# Patient Record
Sex: Female | Born: 1945 | Race: Black or African American | Hispanic: No | Smoking: Never smoker
Health system: Southern US, Community
[De-identification: ages and names within clinical notes are randomized; demographics above are authoritative.]

## PROBLEM LIST (undated history)

## (undated) DIAGNOSIS — F419 Anxiety disorder, unspecified: Secondary | ICD-10-CM

## (undated) DIAGNOSIS — M1710 Unilateral primary osteoarthritis, unspecified knee: Secondary | ICD-10-CM

## (undated) DIAGNOSIS — E7800 Pure hypercholesterolemia, unspecified: Secondary | ICD-10-CM

## (undated) DIAGNOSIS — M1990 Unspecified osteoarthritis, unspecified site: Secondary | ICD-10-CM

## (undated) DIAGNOSIS — G709 Myoneural disorder, unspecified: Secondary | ICD-10-CM

## (undated) DIAGNOSIS — Q6479 Other congenital malformations of bladder and urethra: Secondary | ICD-10-CM

## (undated) DIAGNOSIS — E119 Type 2 diabetes mellitus without complications: Secondary | ICD-10-CM

## (undated) DIAGNOSIS — R350 Frequency of micturition: Secondary | ICD-10-CM

## (undated) DIAGNOSIS — I10 Essential (primary) hypertension: Secondary | ICD-10-CM

## (undated) DIAGNOSIS — R3915 Urgency of urination: Secondary | ICD-10-CM

## (undated) DIAGNOSIS — G4733 Obstructive sleep apnea (adult) (pediatric): Secondary | ICD-10-CM

## (undated) DIAGNOSIS — R011 Cardiac murmur, unspecified: Secondary | ICD-10-CM

## (undated) DIAGNOSIS — I341 Nonrheumatic mitral (valve) prolapse: Secondary | ICD-10-CM

## (undated) DIAGNOSIS — G43909 Migraine, unspecified, not intractable, without status migrainosus: Secondary | ICD-10-CM

## (undated) DIAGNOSIS — N393 Stress incontinence (female) (male): Secondary | ICD-10-CM

## (undated) DIAGNOSIS — R0602 Shortness of breath: Secondary | ICD-10-CM

## (undated) DIAGNOSIS — K219 Gastro-esophageal reflux disease without esophagitis: Secondary | ICD-10-CM

## (undated) HISTORY — PX: CATARACT EXTRACTION W/ INTRAOCULAR LENS  IMPLANT, BILATERAL: SHX1307

## (undated) HISTORY — PX: JOINT REPLACEMENT: SHX530

## (undated) HISTORY — DX: Cardiac murmur, unspecified: R01.1

## (undated) HISTORY — PX: TONSILLECTOMY AND ADENOIDECTOMY: SUR1326

## (undated) HISTORY — PX: DILATION AND CURETTAGE OF UTERUS: SHX78

---

## 1988-04-10 HISTORY — PX: TOTAL ABDOMINAL HYSTERECTOMY: SHX209

## 2010-05-13 ENCOUNTER — Ambulatory Visit (INDEPENDENT_AMBULATORY_CARE_PROVIDER_SITE_OTHER): Payer: Self-pay | Admitting: Urology

## 2010-05-13 DIAGNOSIS — N813 Complete uterovaginal prolapse: Secondary | ICD-10-CM

## 2010-05-13 DIAGNOSIS — N3941 Urge incontinence: Secondary | ICD-10-CM

## 2010-05-13 DIAGNOSIS — N952 Postmenopausal atrophic vaginitis: Secondary | ICD-10-CM

## 2011-07-26 ENCOUNTER — Other Ambulatory Visit: Payer: Self-pay | Admitting: Urology

## 2011-08-21 ENCOUNTER — Encounter (HOSPITAL_BASED_OUTPATIENT_CLINIC_OR_DEPARTMENT_OTHER): Payer: Self-pay | Admitting: *Deleted

## 2011-08-21 NOTE — Progress Notes (Signed)
NPO AFTER MN. ARRIVES AT 0715. NEEDS ISTAT. CURRENT EKG, ECHO, OFFICE NOTE, AND STRESS TEST TO BE FAXED FROM DR [facility] [phone]). WILL TAKE TAGAMENT AM OF SURG. W/ SIP OF WATER.  REVIEWED [facility] GUIDELINES, WILL BRING MEDS.

## 2011-08-25 ENCOUNTER — Encounter (HOSPITAL_BASED_OUTPATIENT_CLINIC_OR_DEPARTMENT_OTHER)
Admission: RE | Disposition: A | Discharge status: Home / Self Care | Payer: Self-pay | Source: Ambulatory Visit | Attending: Urology

## 2011-08-25 ENCOUNTER — Encounter (HOSPITAL_BASED_OUTPATIENT_CLINIC_OR_DEPARTMENT_OTHER): Payer: Self-pay | Admitting: Anesthesiology

## 2011-08-25 ENCOUNTER — Encounter (HOSPITAL_BASED_OUTPATIENT_CLINIC_OR_DEPARTMENT_OTHER): Payer: Self-pay | Admitting: *Deleted

## 2011-08-25 ENCOUNTER — Ambulatory Visit (HOSPITAL_BASED_OUTPATIENT_CLINIC_OR_DEPARTMENT_OTHER)
Admission: RE | Admit: 2011-08-25 | Discharge: 2011-08-26 | Disposition: A | Discharge status: Home / Self Care | Payer: Medicare Other | Source: Ambulatory Visit | Attending: Urology | Admitting: Urology

## 2011-08-25 ENCOUNTER — Ambulatory Visit (HOSPITAL_BASED_OUTPATIENT_CLINIC_OR_DEPARTMENT_OTHER): Payer: Medicare Other | Admitting: Anesthesiology

## 2011-08-25 DIAGNOSIS — I059 Rheumatic mitral valve disease, unspecified: Secondary | ICD-10-CM | POA: Insufficient documentation

## 2011-08-25 DIAGNOSIS — N058 Unspecified nephritic syndrome with other morphologic changes: Secondary | ICD-10-CM | POA: Insufficient documentation

## 2011-08-25 DIAGNOSIS — N8110 Cystocele, unspecified: Secondary | ICD-10-CM | POA: Insufficient documentation

## 2011-08-25 DIAGNOSIS — K219 Gastro-esophageal reflux disease without esophagitis: Secondary | ICD-10-CM | POA: Insufficient documentation

## 2011-08-25 DIAGNOSIS — Z79899 Other long term (current) drug therapy: Secondary | ICD-10-CM | POA: Insufficient documentation

## 2011-08-25 DIAGNOSIS — G4730 Sleep apnea, unspecified: Secondary | ICD-10-CM | POA: Insufficient documentation

## 2011-08-25 DIAGNOSIS — E7800 Pure hypercholesterolemia, unspecified: Secondary | ICD-10-CM | POA: Insufficient documentation

## 2011-08-25 DIAGNOSIS — N816 Rectocele: Secondary | ICD-10-CM | POA: Insufficient documentation

## 2011-08-25 DIAGNOSIS — N3941 Urge incontinence: Secondary | ICD-10-CM | POA: Insufficient documentation

## 2011-08-25 DIAGNOSIS — E1129 Type 2 diabetes mellitus with other diabetic kidney complication: Secondary | ICD-10-CM | POA: Insufficient documentation

## 2011-08-25 DIAGNOSIS — I10 Essential (primary) hypertension: Secondary | ICD-10-CM | POA: Insufficient documentation

## 2011-08-25 HISTORY — DX: Urgency of urination: R39.15

## 2011-08-25 HISTORY — DX: Frequency of micturition: R35.0

## 2011-08-25 HISTORY — DX: Shortness of breath: R06.02

## 2011-08-25 HISTORY — DX: Gastro-esophageal reflux disease without esophagitis: K21.9

## 2011-08-25 HISTORY — PX: PUBOVAGINAL SLING: SHX1035

## 2011-08-25 HISTORY — DX: Other congenital malformations of bladder and urethra: Q64.79

## 2011-08-25 HISTORY — DX: Obstructive sleep apnea (adult) (pediatric): G47.33

## 2011-08-25 HISTORY — DX: Nonrheumatic mitral (valve) prolapse: I34.1

## 2011-08-25 HISTORY — DX: Stress incontinence (female) (male): N39.3

## 2011-08-25 HISTORY — DX: Migraine, unspecified, not intractable, without status migrainosus: G43.909

## 2011-08-25 LAB — POCT I-STAT, CHEM 8
BUN: 11 mg/dL (ref 6–23)
Calcium, Ion: 1.33 mmol/L — ABNORMAL HIGH (ref 1.12–1.32)
Chloride: 106 mEq/L (ref 96–112)
Creatinine, Ser: 0.8 mg/dL (ref 0.50–1.10)

## 2011-08-25 LAB — GLUCOSE, CAPILLARY
Glucose-Capillary: 131 mg/dL — ABNORMAL HIGH (ref 70–99)
Glucose-Capillary: 238 mg/dL — ABNORMAL HIGH (ref 70–99)

## 2011-08-25 SURGERY — CREATION, PUBOVAGINAL SLING
Anesthesia: General

## 2011-08-25 MED ORDER — METANX 3-35-2 MG PO TABS: 1.0000 | ORAL_TABLET | Freq: Every day | ORAL | Status: DC

## 2011-08-25 MED ORDER — ACETAMINOPHEN 10 MG/ML IV SOLN
1000.0000 mg | Freq: Four times a day (QID) | INTRAVENOUS | Status: DC
  Administered 2011-08-25 – 2011-08-26 (×2): 1000 mg via INTRAVENOUS

## 2011-08-25 MED ORDER — SODIUM CHLORIDE 0.9 % IJ SOLN
INTRAMUSCULAR | Status: DC | PRN
  Administered 2011-08-25: 50 mL

## 2011-08-25 MED ORDER — STERILE WATER FOR IRRIGATION IR SOLN
Status: DC | PRN
  Administered 2011-08-25: 3000 mL via INTRAVESICAL

## 2011-08-25 MED ORDER — SODIUM CHLORIDE 0.9 % IR SOLN
Status: DC | PRN
  Administered 2011-08-25: 10:00:00

## 2011-08-25 MED ORDER — GENTAMICIN IN SALINE 1.6-0.9 MG/ML-% IV SOLN
80.0000 mg | INTRAVENOUS | Status: AC
  Administered 2011-08-25: 80 mg via INTRAVENOUS

## 2011-08-25 MED ORDER — KETOROLAC TROMETHAMINE 30 MG/ML IJ SOLN
INTRAMUSCULAR | Status: DC | PRN
  Administered 2011-08-25: 15 mg via INTRAVENOUS

## 2011-08-25 MED ORDER — DIPHENHYDRAMINE HCL 50 MG/ML IJ SOLN: 12.5000 mg | Freq: Four times a day (QID) | INTRAMUSCULAR | Status: DC | PRN

## 2011-08-25 MED ORDER — OXYBUTYNIN CHLORIDE 5 MG PO TABS: 5.0000 mg | ORAL_TABLET | Freq: Three times a day (TID) | ORAL | Status: DC | PRN

## 2011-08-25 MED ORDER — HYDROMORPHONE HCL PF 1 MG/ML IJ SOLN: 0.5000 mg | INTRAMUSCULAR | Status: DC | PRN

## 2011-08-25 MED ORDER — DIPHENHYDRAMINE HCL 12.5 MG/5ML PO ELIX: 12.5000 mg | ORAL_SOLUTION | Freq: Four times a day (QID) | ORAL | Status: DC | PRN

## 2011-08-25 MED ORDER — ACETAMINOPHEN 10 MG/ML IV SOLN
INTRAVENOUS | Status: DC | PRN
  Administered 2011-08-25: 1000 mg via INTRAVENOUS

## 2011-08-25 MED ORDER — ZOLPIDEM TARTRATE 5 MG PO TABS: 5.0000 mg | ORAL_TABLET | Freq: Every evening | ORAL | Status: DC | PRN

## 2011-08-25 MED ORDER — SENNA 8.6 MG PO TABS
1.0000 | ORAL_TABLET | Freq: Two times a day (BID) | ORAL | Status: DC
  Administered 2011-08-25: 8.6 mg via ORAL

## 2011-08-25 MED ORDER — KETOROLAC TROMETHAMINE 30 MG/ML IJ SOLN
15.0000 mg | Freq: Once | INTRAMUSCULAR | Status: AC | PRN
  Administered 2011-08-25: 30 mg via INTRAVENOUS

## 2011-08-25 MED ORDER — ONDANSETRON HCL 4 MG/2ML IJ SOLN
INTRAMUSCULAR | Status: DC | PRN
  Administered 2011-08-25: 4 mg via INTRAVENOUS

## 2011-08-25 MED ORDER — SODIUM CHLORIDE 0.45 % IV SOLN: INTRAVENOUS | Status: DC

## 2011-08-25 MED ORDER — DEXAMETHASONE SODIUM PHOSPHATE 4 MG/ML IJ SOLN
INTRAMUSCULAR | Status: DC | PRN
  Administered 2011-08-25: 4 mg via INTRAVENOUS

## 2011-08-25 MED ORDER — ONDANSETRON HCL 4 MG/2ML IJ SOLN: 4.0000 mg | INTRAMUSCULAR | Status: DC | PRN

## 2011-08-25 MED ORDER — HYDROMORPHONE HCL PF 1 MG/ML IJ SOLN: 0.2500 mg | INTRAMUSCULAR | Status: DC | PRN

## 2011-08-25 MED ORDER — LIDOCAINE HCL (CARDIAC) 20 MG/ML IV SOLN
INTRAVENOUS | Status: DC | PRN
  Administered 2011-08-25: 50 mg via INTRAVENOUS

## 2011-08-25 MED ORDER — PROPOFOL 10 MG/ML IV EMUL
INTRAVENOUS | Status: DC | PRN
  Administered 2011-08-25: 200 mg via INTRAVENOUS

## 2011-08-25 MED ORDER — FENTANYL CITRATE 0.05 MG/ML IJ SOLN
INTRAMUSCULAR | Status: DC | PRN
  Administered 2011-08-25: 12.5 ug via INTRAVENOUS
  Administered 2011-08-25: 25 ug via INTRAVENOUS
  Administered 2011-08-25: 12.5 ug via INTRAVENOUS
  Administered 2011-08-25: 50 ug via INTRAVENOUS
  Administered 2011-08-25 (×2): 25 ug via INTRAVENOUS

## 2011-08-25 MED ORDER — ESTRADIOL 0.1 MG/GM VA CREA
TOPICAL_CREAM | VAGINAL | Status: DC | PRN
  Administered 2011-08-25: 3 via VAGINAL

## 2011-08-25 MED ORDER — CIPROFLOXACIN HCL 250 MG PO TABS: 250.0000 mg | ORAL_TABLET | Freq: Two times a day (BID) | ORAL | Status: AC

## 2011-08-25 MED ORDER — INDIGOTINDISULFONATE SODIUM 8 MG/ML IJ SOLN
INTRAMUSCULAR | Status: DC | PRN
  Administered 2011-08-25: 5 mL via INTRAVENOUS

## 2011-08-25 MED ORDER — PROMETHAZINE HCL 25 MG/ML IJ SOLN: 6.2500 mg | INTRAMUSCULAR | Status: DC | PRN

## 2011-08-25 MED ORDER — ENOXAPARIN SODIUM 40 MG/0.4ML ~~LOC~~ SOLN: 40.0000 mg | SUBCUTANEOUS | Status: DC

## 2011-08-25 MED ORDER — ATENOLOL 12.5 MG HALF TABLET: 12.5000 mg | ORAL_TABLET | Freq: Every day | ORAL | Status: DC

## 2011-08-25 MED ORDER — BUPIVACAINE-EPINEPHRINE 0.5% -1:200000 IJ SOLN
INTRAMUSCULAR | Status: DC | PRN
  Administered 2011-08-25: 30 mL

## 2011-08-25 MED ORDER — ASPIRIN-ACETAMINOPHEN-CAFFEINE 250-250-65 MG PO TABS: 1.0000 | ORAL_TABLET | Freq: Four times a day (QID) | ORAL | Status: DC | PRN

## 2011-08-25 MED ORDER — BELLADONNA ALKALOIDS-OPIUM 16.2-60 MG RE SUPP
RECTAL | Status: DC | PRN
  Administered 2011-08-25: 1 via RECTAL

## 2011-08-25 MED ORDER — CIPROFLOXACIN IN D5W 400 MG/200ML IV SOLN
400.0000 mg | INTRAVENOUS | Status: AC
  Administered 2011-08-25: 400 mg via INTRAVENOUS

## 2011-08-25 MED ORDER — FAMOTIDINE 10 MG PO TABS: 10.0000 mg | ORAL_TABLET | Freq: Every day | ORAL | Status: DC

## 2011-08-25 MED ORDER — LACTATED RINGERS IV SOLN
INTRAVENOUS | Status: DC
  Administered 2011-08-25 (×2): via INTRAVENOUS

## 2011-08-25 MED ORDER — METFORMIN HCL 500 MG PO TABS: 500.0000 mg | ORAL_TABLET | Freq: Two times a day (BID) | ORAL | Status: DC

## 2011-08-25 MED ORDER — HYDROCODONE-ACETAMINOPHEN 5-325 MG PO TABS: 1.0000 | ORAL_TABLET | ORAL | Status: DC | PRN

## 2011-08-25 SURGICAL SUPPLY — 53 items
ADH SKN CLS APL DERMABOND .7 (GAUZE/BANDAGES/DRESSINGS)
BAG URINE DRAINAGE (UROLOGICAL SUPPLIES) ×2 IMPLANT
BLADE SURG 10 STRL SS (BLADE) ×2 IMPLANT
BLADE SURG 15 STRL LF DISP TIS (BLADE) ×1 IMPLANT
BLADE SURG 15 STRL SS (BLADE) ×2
BLADE SURG ROTATE 9660 (MISCELLANEOUS) ×2 IMPLANT
BOOTIES KNEE HIGH SLOAN (MISCELLANEOUS) ×2 IMPLANT
BOSTON SCIENTIFIC UPHOLD LITE VAGINAL SUPPORT SYSTEM IMPLANT
CANISTER SUCTION 1200CC (MISCELLANEOUS) IMPLANT
CANISTER SUCTION 2500CC (MISCELLANEOUS) ×4 IMPLANT
CATH FOLEY 2WAY SLVR  5CC 16FR (CATHETERS) ×1
CATH FOLEY 2WAY SLVR 5CC 16FR (CATHETERS) ×1 IMPLANT
CLOTH BEACON ORANGE TIMEOUT ST (SAFETY) ×2 IMPLANT
COVER MAYO STAND STRL (DRAPES) ×2 IMPLANT
COVER TABLE BACK 60X90 (DRAPES) ×2 IMPLANT
DERMABOND ADVANCED (GAUZE/BANDAGES/DRESSINGS)
DERMABOND ADVANCED .7 DNX12 (GAUZE/BANDAGES/DRESSINGS) IMPLANT
DISSECTOR ROUND CHERRY 3/8 STR (MISCELLANEOUS) ×2 IMPLANT
DRAPE CAMERA CLOSED 9X96 (DRAPES) ×2 IMPLANT
DRAPE UNDERBUTTOCKS STRL (DRAPE) ×2 IMPLANT
FLOSEAL 10ML (HEMOSTASIS) IMPLANT
GAUZE SPONGE 4X4 16PLY XRAY LF (GAUZE/BANDAGES/DRESSINGS) IMPLANT
GLOVE BIO SURGEON STRL SZ7.5 (GLOVE) ×2 IMPLANT
GLOVE INDICATOR 6.5 STRL GRN (GLOVE) ×2 IMPLANT
GLOVE INDICATOR 7.0 STRL GRN (GLOVE) ×2 IMPLANT
GOWN PREVENTION PLUS LG XLONG (DISPOSABLE) ×2 IMPLANT
GOWN STRL REIN XL XLG (GOWN DISPOSABLE) ×2 IMPLANT
NEEDLE HYPO 22GX1.5 SAFETY (NEEDLE) ×4 IMPLANT
PACK BASIN DAY SURGERY FS (CUSTOM PROCEDURE TRAY) ×2 IMPLANT
PACKING VAGINAL (PACKING) ×4 IMPLANT
PENCIL BUTTON HOLSTER BLD 10FT (ELECTRODE) ×2 IMPLANT
PLUG CATH AND CAP STER (CATHETERS) ×2 IMPLANT
RETRACTOR LONRSTAR 16.6X16.6CM (MISCELLANEOUS) ×1 IMPLANT
RETRACTOR STAY HOOK 5MM (MISCELLANEOUS) ×2 IMPLANT
RETRACTOR STER APS 16.6X16.6CM (MISCELLANEOUS) ×2
SET IRRIG Y TYPE TUR BLADDER L (SET/KITS/TRAYS/PACK) ×2 IMPLANT
SHEET LAVH (DRAPES) ×2 IMPLANT
SLING SOLYX SYSTEM SIS BX (SLING) IMPLANT
SPONGE LAP 4X18 X RAY DECT (DISPOSABLE) ×4 IMPLANT
SUCTION FRAZIER TIP 10 FR DISP (SUCTIONS) ×2 IMPLANT
SUT ETHILON 2 0 PS N (SUTURE) IMPLANT
SUT MON AB 2-0 SH 27 (SUTURE) ×2
SUT MON AB 2-0 SH27 (SUTURE) ×1 IMPLANT
SUT SILK 3 0 PS 1 (SUTURE) IMPLANT
SUT VIC AB 2-0 UR6 27 (SUTURE) ×16 IMPLANT
SYR BULB IRRIGATION 50ML (SYRINGE) ×2 IMPLANT
SYR CONTROL 10ML LL (SYRINGE) ×2 IMPLANT
SYRINGE 10CC LL (SYRINGE) ×4 IMPLANT
TRAY DSU PREP LF (CUSTOM PROCEDURE TRAY) ×2 IMPLANT
TUBE CONNECTING 12X1/4 (SUCTIONS) ×4 IMPLANT
Uphold LITE Vaginal Support System (Mesh Fixation) ×2 IMPLANT
WATER STERILE IRR 500ML POUR (IV SOLUTION) ×8 IMPLANT
YANKAUER SUCT BULB TIP NO VENT (SUCTIONS) IMPLANT

## 2011-08-25 NOTE — H&P (Signed)
Problems  1. 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 Urge Incontinence Of Urine 788.31 4. 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 Vaginal Wall Prolapse With Rectocele 618.04  History of Present Illness    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 She is a 3 year history of progressive vaginal prolapse, protruding from the introitus. She complains of urge incontinence, worse with standing from a sitting position, nocturia x1, and urinary daytime frequency q.1 hour. She denies stress incontinence. She is status post hysterectomy in 1990, and is gravida 3 para 3. She has been treated with anticholinergics in the past, with Detrol and Vesicare, but neither medications worked. She has no tobacco history. She is not sexually active. She does not complain of vaginal dryness. 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  Her physical examination showed a midline anterior vaginal wall defect, grade 3, stage III. There is no enterocele found there is a rectocele, with a midline defect, grade 1, stage I. The cervix was absent the uterus is absent. The bladder was otherwise normal and nontender. The rectum also appeared normal on examination.  Urodynamics on 07/18/11, shows a maximum capacity of 500 cc, with first sensation at 250 cc, and normal desire at 306 cc peri-strong desire was at 500 cc. The bladder becomes unstable, with a contraction pressure of 12 cm water. No urine leakage occurs, however.  The patient did not leak for Valsalva leak point pressure, with abdominal pressures of 104-113 cm water. There was no stress incontinence noted with or without reduction of the prolapse. She is a very weak Valsalva, and very weak cough, however.  Pressure flow study shows a maximum flow rate of 16 cc per second, with detrusor pressure at maximum flow of 9 cm water, and PVR 40.  There was no stress  incontinence noted with this study, with or without reduction of the prolapse. While she does have some bladder instability, she is able to inhibit these contractions. The patient was able to generate a voluntary contraction. She was able to void with the prolapse reduced. PVRs 40 cc.   Past Medical History Problems  1. History of  Adult Sleep Apnea 780.57 2. History of  Anxiety (Symptom) 300.00 3. History of  Depression 311 4. History of  Diabetes Mellitus 250.00 5. History of  Esophageal Reflux 530.81 6. History of  Hypercholesterolemia 272.0 7. History of  Hypertension 401.9 8. History of  Murmurs 785.2 9. History of  Peripheral Neuropathy 356.9 10. 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 History of  Hysterectomy V45.77 2. History of  Tonsillectomy  Current Meds 1. Atenolol TABS; Therapy: (Recorded:03Feb2012) to 2. Fish Oil CAPS; Therapy: (Recorded:03Feb2012) to 3. Metanx TABS; Therapy: (Recorded:03Feb2012) to 4. MetFORMIN HCl TABS; Therapy: (Recorded:03Feb2012) to 5. St Johns Wort CAPS; Therapy: (Recorded:03Feb2012) to 6. Vitamin D CAPS; Therapy: (Recorded:03Feb2012) to 7. Vitamin E CAPS; Therapy: (Recorded:03Feb2012) to  Allergies Medication  1. No Known Drug Allergies  Family History Problems  1. Family history of  Cirrhosis 2. Family history of  Death In The Family Father 46 yo - cirrhosis 3. Family history of  Death In The Family Mother 42 yo - lung disease 4. Family history of  Diabetes Mellitus V18.0 5. Family history of  Family Health Status Number Of Children 2 sons. 2 dtrs. 6. Family history of  Lung Cancer V16.1  Social History Problems  1.  Caffeine Use 1/d 2. Marital History - Single 3. Never A Smoker 4. Occupation: Retired Nurse, children's  5. History of  Alcohol Use 6. History of  Tobacco Use  Review of Systems Genitourinary, constitutional, skin, eye, otolaryngeal, hematologic/lymphatic, cardiovascular,  pulmonary, endocrine, musculoskeletal, gastrointestinal, neurological and psychiatric system(s) were reviewed and pertinent findings if present are noted.  Genitourinary: urinary frequency, urinary urgency, nocturia and incontinence.  Gastrointestinal: heartburn and diarrhea.  Constitutional: feeling tired (fatigue).  Integumentary: skin rash/lesion and pruritus.  Hematologic/Lymphatic: a tendency to easily bruise.  Cardiovascular: leg swelling.  Neurological: headache.  Psychiatric: depression.    Vitals Vital Signs [Data Includes: Last 1 Day]  17Apr2013 11:00AM  Blood Pressure: 114 / 78 Temperature: 97.6 F Heart Rate: 71  Physical Exam Genitourinary:. NST. Weak Levator contraction. Examination of the external genitalia shows vulvar atrophy, but normal female external genitalia, no vulvar mass and no labial adhesions. The urethra is normal in appearance, not tender and no urethral caruncle. There is no urethral mass. Urethral hypermobility is present. There is no urethral discharge. No periurethral cyst is identified. There is no urethral prolapse. Vaginal exam demonstrates atrophy and the vaginal epithelium to be poorly estrogenized. A cystocele is present with a midline defect (grade 3 /4). No enterocele is identified. A rectocele is present with a midline defect (grade 1 /4). The cervix is is absent. The uterus is absent. The bladder is normal on palpation, non tender and not distended. The anus is normal on inspection.    Results/Data Urine [Data Includes: Last 1 Day]   17Apr2013  COLOR YELLOW   APPEARANCE CLEAR   SPECIFIC GRAVITY 1.025   pH 5.5   GLUCOSE NEG mg/dL  BILIRUBIN NEG   KETONE NEG mg/dL  BLOOD NEG   PROTEIN NEG mg/dL  UROBILINOGEN 0.2 mg/dL  NITRITE NEG   LEUKOCYTE ESTERASE NEG    Assessment Assessed  1. 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 Urge Incontinence Of Urine 788.31 3. Vaginal Wall Prolapse With Midline Cystocele 618.01 4. Postmenopausal Atrophic Vaginitis  627.3      Mitral vlave prolapse in pt with Stage II cystocele, and no incontinence, even with reduction of her prolapse. We have discussed vaginal prolapse, the history, anatomy, and physiology reviewed.  We have discusssed complications also, and have presented 3 options for repair, including, simple Kelly plication ( 50% failure rate), tissue repair ( 20% falilure rate in 2-5 yrs); and [employer] mesh repair ( 3% failure rate in 2-5 yrs). Additional complications of mesh are exposure ( 2-3% chance,. treated with hormones or surgerical excision. We have discussed the operation, treatment of pain, and post op expectations. She may drive by 1 week. She may need future anti-incontinence surgery, but no indication for that at this time.    We have discussed her hernia-or weakness- in the vaginal wall, allowing one or more organs to begin to descend-both by the pull of gravity, and by the push of abdominal straining-into, and occasionally through-the vagina. This anatomic herniation is called Pelvic Organ Prolapse(POP).   She  has:  (1) weakness of the urethra- with hypermobility but withour  "stress "(cough laugh, or sneeze) urinary incontinence; (2) weakness of the anterior vaginal wall, resulting in a Cystocele; and (3), weakness  of the posterior vaginal wall, without Enterocele(small intestine herniation  high up through the posterior vaginal wall), but with small  Rectocele(rectum herniating through the posterior vaginal wall).   These  anatomic problems may be directly related to childbirth, neuropathies, diabetes, constipation, cigarette smoking, steroid  use, diseases of the connective tissue(eg: lupus, steroids for chronic asthma), or previous pelvic surgery, radiation, or chemotherapy. The surgical repair which is recommended for you is based on an integration of your symptoms, your medical history, your physical examination; as well as your video urodynamic studies, and laboratory  evaluation. In the end, the final anatomic evaluation is performed under anesthesia, and it is for this reason, that the end result operation may be slightly different than the preoperative evaluation might suggest. We are prepared to repair the pelvic floor in any number of ways, which will be explained to you.    Pelvic floor repair was first reported in 1913, with success rates of 50-90%. However, long-term followup of repairs using absorbable tissue has shown 20% to 50% recurrence rates. Therefore, when synthetic mesh was introduced into pelvic floor repairs in 2000, it was quickly incorporated into surgical approaches. However, early evaluation of surgical mesh (no longer in use)(2000 -2005) showed technical flaws which led to erosion of the mesh through the vaginal incision. This has led to multiple lawsuits with regard to the use of vaginal mesh. Currently, mesh erosion is noted to be a complication of pelvic organ prolapse surgery, as reported in 2-11% of cases.  Evaluation of a single surgeon's experience with the newest vaginal mesh [facility] Scientific mesh, Patsi Sears, January 2009 through August 2011) has shown no erosions from pubovaginal sling surgery, and a 2% erosion rate from other larger pelvic floor organ repair surgery. These extrusions have been treated by vaginal hormone treatment, and surgical excision of a tiny portion of the extruded mesh. There have been no severe long-term complications noted (up to 3 year follow-up).    Anterior vault suspension is accomplished by placing mesh between the bladder  and vaginal wall, and attaching the mesh to the sacrospinous ligaments. This prevents the anterior vault hernia from recurring.  Anterior vault suspension can also be accomplished via the laparoscopic robotic approach. While this approach gives excellent results, it is more expensive, and more invasive. Transvaginal surgery allows the patient to have natural orifice surgical approach  ("Notes" approach), which is less invasive, and less expensive, while providing superb results. Recent evaluation (see above) has shown a single surgeon's results with 100 cases of anterior and posterior vault suspension using [employer] mesh. These results show a 2-3% recurrence rate, 2-3% extrusion rate, on patients with grade 4 prolapse (worst prolapse).  Common questions for patient's regarding pelvic organ prolapse are as follows:  #1  Is mesh to be used in my surgery?   #2   Am I a good candidate for mesh?  #3 Why is mesh the best option for my repair?  #4 What are the alternatives to transvaginal mesh for repair of my prolapse?  #5 Could my surgery be performed without using mesh? #6 Will my partner be able to feel the surgical mesh during sexual intercourse, and what if the mesh erodes through the vaginal wall?  #7 How often have you ,as a Careers adviser ,implanted this particular product?  #8 What results have your other patients  had with this product?  #9 What can I expect to feel after surgery, and for how long?  #10 Which specific side effects should I report after surgery ? #11 What if the mesh surgery doesn't correct the problem ?   I believe surgical mesh implantation will correct your pelvic organ prolapse problem, with a very low chance of complications. As per the FDA  communication of July 2011, is  important to understand that Dr.Alanis Clift has had 32 years of surgical experience in incontinence and pelvic organ repair, with special training in the implantation of pelvic organ prolapse mesh. He is an Secondary school teacher in the [employer] pelvic organ prolapse surgical teaching laboratories. In consideration of all types of pelvic organ repairs, please know that we have considered" no repair"; repair via vaginal approach; repair via abdominal approach-using native tissue, absorbable tissue, and nonabsorbable mesh. All of the  above questions have been dicussed wit th  patient, and she is satisfied with our approach to her repair.    Plan Urge Incontinence Of Urine (788.31)  1. VESIcare 5 MG Oral Tablet; a tablet daily for 30 days; Therapy: 23Apr2012 to 17Apr2013; Last  Rx:23Apr2012; Status: DISCONTINUED Vaginal Wall Prolapse With Midline Cystocele (618.01), Postmenopausal Atrophic Vaginitis (627.3), Diabetic Nephropathy (583.81)  2. Follow-up Schedule Surgery Office  Follow-up  Done: 17Apr2013      After consideration of her options, she desires to have mesh implantation for her vault repair. We have discussed the surgery, complications, and post op care.   Signatures Electronically signed by : [name], M.D.; Jul 26 2011 12:05PM

## 2011-08-25 NOTE — Anesthesia Procedure Notes (Signed)
Procedure Name: LMA Insertion Date/Time: 08/25/2011 8:48 AM Performed by: Norva Pavlov Pre-anesthesia Checklist: Patient identified, Emergency Drugs available, Suction available and Patient being monitored Patient Re-evaluated:Patient Re-evaluated prior to inductionOxygen Delivery Method: Circle System Utilized Preoxygenation: Pre-oxygenation with 100% oxygen Intubation Type: IV induction Ventilation: Mask ventilation without difficulty LMA: LMA with gastric port inserted LMA Size: 4.0 Number of attempts: 1 Placement Confirmation: positive ETCO2 Tube secured with: Tape Dental Injury: Teeth and Oropharynx as per pre-operative assessment

## 2011-08-25 NOTE — Anesthesia Preprocedure Evaluation (Addendum)
Anesthesia Evaluation  Patient identified by MRN, date of birth, ID band Patient awake    Reviewed: Allergy & Precautions, H&P , NPO status , Patient's Chart, lab work & pertinent test results  Airway Mallampati: II TM Distance: <3 FB Neck ROM: Full    Dental No notable dental hx.    Pulmonary sleep apnea ,  breath sounds clear to auscultation  Pulmonary exam normal       Cardiovascular hypertension, Pt. on medications Rhythm:Regular Rate:Normal     Neuro/Psych negative neurological ROS  negative psych ROS   GI/Hepatic Neg liver ROS, GERD-  Medicated,  Endo/Other  Diabetes mellitus-, Type obesity  Renal/GU negative Renal ROS  negative genitourinary   Musculoskeletal negative musculoskeletal ROS (+)   Abdominal   Peds negative pediatric ROS (+)  Hematology negative hematology ROS (+)   Anesthesia Other Findings   Reproductive/Obstetrics negative OB ROS                          Anesthesia Physical Anesthesia Plan  ASA: II  Anesthesia Plan: General   Post-op Pain Management:    Induction: Intravenous  Airway Management Planned: LMA and Oral ETT  Additional Equipment:   Intra-op Plan:   Post-operative Plan: Extubation in OR  Informed Consent: I have reviewed the patients History and Physical, chart, labs and discussed the procedure including the risks, benefits and alternatives for the proposed anesthesia with the patient or authorized representative who has indicated his/her understanding and acceptance.   Dental advisory given  Plan Discussed with: CRNA  Anesthesia Plan Comments:        Anesthesia Quick Evaluation

## 2011-08-25 NOTE — Op Note (Signed)
Pre-operative diagnosis : Anterior vaginal vault prolapse, stage III  Postoperative diagnosis: Same  Operation: [employer] uphold light anterior vault sacrospinous fixation mesh repair with Tresa Endo plication  Surgeon:  S. Patsi Sears, MD  First assistant: None  Anesthesia:  general  Preparation: After appropriate preanesthesia, the patient was brought to the operating room and placed on the operating table in the dorsal supine position, where general LMA anesthesia was introduced. Armband was rechecked. She was replaced in the dorsal lithotomy position, where the pubis was prepped with Betadine solution, and draped in usual fashion.  Review history: The patient is a 66 year old female with a protruding vaginal vault prolapse, noted to have a stage III anterior vault prolapse. She has no stress incontinence, either by clinical history, or 01 video urodynamics. She was noted to have a small stage I rectocele, it was felt that by repairing her Level One vault suspension that she may not need to have posterior pelvic floor repair. In addition, she understands that she may need a sling in the future, and that this can be accomplished in outpatient setting.  Statement of  Likelihood of Success: Excellent. TIME-OUT observed.:  Procedure: Visual inspection shows a large anterior vault prolapse, 3 cm beyond the hymenal ring. Foley catheter is placed and the portion retractors placed. Using a marking pen, the bladder neck is identified, and a semilunar incision is outlined 2 cm proximal to the urethral vesical junction. Infusion of 50 cc of Marcaine 0.25 with epinephrine 1 200,000 and normal saline mixture is then used for tissue dissection. A 4 cm horizontal incision is then made and subcutaneous tissue was dissected. Using sharp and blunt dissection, the anterior vaginal wall is dissected free from the bladder. A small Kelly plication is then accomplished. Dissection is carried into the pelvic floor.  The ischial spines are identified, and the sacrospinous ligament is identified bilaterally. On the left side, the ligament is noted to be bulky filamentous. Indigocarmine was given at this time.  Using the [facility] device, the [employer] Mesh limbs wereplaced through the sacrospinous ligamenst bilaterally, one to 2 fingerbreadths medial to the ischial spines. The limbs are then brought across the wound lateralward, and the mesh is anchored around the apex with 2-0 Vicryl suture in 3 areas proximalward, and 3 areas distalward. The mesh was smooth against the bladder, without tension. The plastic sleeves were cut appropriately, and removed. Cystoscopy was accomplished, and jets of blue contrast were identified from each orifice. The wound edges were freshened, and closed with 2 separate 2-0 Vicryl sutures. Vaginal packing was placed. The patient was then awakened and taken to recovery room in good condition.

## 2011-08-25 NOTE — Transfer of Care (Signed)
Immediate Anesthesia Transfer of Care Note  Patient: Kristin Farley  Procedure(s) Performed: Procedure(s) (LRB): PUBO-VAGINAL SLING (N/A)  Patient Location: PACU  Anesthesia Type: General  Level of Consciousness: awake, drowsy and oriented  Airway & Oxygen Therapy: Patient Spontanous Breathing and Patient connected to face mask oxygen  Post-op Assessment: Report given to PACU RN and Post -op Vital signs reviewed and stable  Post vital signs: Reviewed and stable  Complications: No apparent anesthesia complications

## 2011-08-25 NOTE — Interval H&P Note (Signed)
History and Physical Interval Note:  08/25/2011 8:28 AM  Kristin Farley  has presented today for surgery, with the diagnosis of ANTERIOR VAGINAL VAULT PROLAPSE  The various methods of treatment have been discussed with the patient and family. After consideration of risks, benefits and other options for treatment, the patient has consented to  Procedure(s) (LRB): PUBO-VAGINAL SLING (N/A) as a surgical intervention .  The patients' history has been reviewed, patient examined, no change in status, stable for surgery.  I have reviewed the patients' chart and labs.  Questions were answered to the patient's satisfaction.     [name] I

## 2011-08-25 NOTE — Anesthesia Postprocedure Evaluation (Signed)
  Anesthesia Post-op Note  Patient: Kristin Farley  Procedure(s) Performed: Procedure(s) (LRB): PUBO-VAGINAL SLING (N/A)  Patient Location: PACU  Anesthesia Type: General  Level of Consciousness: awake and alert   Airway and Oxygen Therapy: Patient Spontanous Breathing  Post-op Pain: mild  Post-op Assessment: Post-op Vital signs reviewed, Patient's Cardiovascular Status Stable, Respiratory Function Stable, Patent Airway and No signs of Nausea or vomiting  Post-op Vital Signs: stable  Complications: No apparent anesthesia complications

## 2011-08-26 NOTE — Discharge Instructions (Signed)
Urethral Vaginal Sling A urethral vaginal sling can be done to correct urinary incontinence (UI). Urinary incontinence is uncontrolled loss of urine. It is very common in both women who have had children and in aging women. The purpose of the "urethral vaginal sling" is to place a strong piece of material (i.e., tension-free vaginal tape or nylon mesh) that fits under the urethra like a hammock. The urethra is the tube that drains your bladder. This sling is put in position to straighten, support and hold the urethra in its normal position. This is a common surgical procedure for this problem. LET YOUR CAREGIVER KNOW ABOUT:   Any allergies to foods or medications.   All the medications you are taking. This includes over-the-counter drugs, herbs, eye drops, creams and steroids.   Use of illegal drugs.   Smoking or heavy alcohol drinking.   Past problems with anesthetics.   Possibility of being pregnant.   History of blood clots or other blood problems.   History of bleeding problems.   Past surgery.   Other medical or health problems.  RISKS AND COMPLICATIONS   Infection.   Excessive bleeding.   Damage to other organs.   Problems urinating properly for several days or weeks.   Problems from the anesthesia.   The UI can come back.  BEFORE THE PROCEDURE   Do not take aspirin or blood thinners a week before the surgery unless you are told otherwise.   Do not eat or drink anything 8 hours before the surgery.   If you smoke, do not smoke at least two weeks before the surgery.   Let your caregiver know if you get a cold or infection before your surgery.   If you will be admitted the same day as the surgery, get to the hospital at least one hour before the surgery.   Arrange for someone to take you home from the hospital and for help at home.  AFTER THE PROCEDURE   You will be taken to recovery area where nurses will monitor your progress and vital signs (blood pressure,  pulse, breathing, temperature). They will move you to your room when you are stable.   You will have a catheter in your bladder until you can urinate properly.   You may have a gauze packing in the vagina to prevent bleeding. This will be removed in one to two days.   You are usually in the hospital 2 to 3 days.  HOME CARE INSTRUCTIONS   Take showers instead of baths until you are informed otherwise.   Resume your usual diet.   Get rest and sleep.   Try to have someone at home for 2 to 3 weeks to help you with your household activities.   Do not lift anything over 5 pounds.   Only take over-the-counter or prescription medicines for pain, discomfort or fever as directed by your caregiver. Do not take aspirin because it can cause bleeding.   Do not douche, exercise, use tampons or have sexual intercourse until your caregiver gives you permission.  SEEK MEDICAL CARE IF:   You have a heavy or bad smelling vaginal discharge.   You develop a rash.   You need stronger pain medication.   You are having side effects from your medications.   You develop lightheadedness or feel faint.  SEEK IMMEDIATE MEDICAL CARE IF:   You develop a temperature of 100 F (37.8 C) or higher.   You have vaginal bleeding.   You faint   or pass out.   You develop shortness of breath.   You develop chest, abdominal or leg pain.   You develop pain when urinating or cannot urinate.   Your catheter is still in your bladder and it blocks up.   You develop swelling, redness and pain in the vaginal area.  Document Released: 01/04/2008 Document Revised: 03/16/2011 Document Reviewed: 01/04/2008 [employer] Patient Information 2012 [employer]. 

## 2011-08-28 ENCOUNTER — Encounter (HOSPITAL_BASED_OUTPATIENT_CLINIC_OR_DEPARTMENT_OTHER): Payer: Self-pay | Admitting: Urology

## 2012-02-22 ENCOUNTER — Encounter (INDEPENDENT_AMBULATORY_CARE_PROVIDER_SITE_OTHER): Payer: Self-pay | Admitting: General Surgery

## 2012-02-22 ENCOUNTER — Ambulatory Visit (INDEPENDENT_AMBULATORY_CARE_PROVIDER_SITE_OTHER): Payer: Medicare Other | Admitting: General Surgery

## 2012-02-22 VITALS — BP 124/70 | HR 76 | Temp 97.2°F | Resp 20 | Ht 64.0 in | Wt 181.4 lb

## 2012-02-22 DIAGNOSIS — K5900 Constipation, unspecified: Secondary | ICD-10-CM

## 2012-02-22 DIAGNOSIS — R159 Full incontinence of feces: Secondary | ICD-10-CM

## 2012-02-22 NOTE — Progress Notes (Addendum)
Chief Complaint  Patient presents with  . New Evaluation    eval of fecal incontinence    HISTORY:  Kristin Farley is a 66 y.o. female who presents to clinic with fecal incontinence.  She is s/p colpopexy with mesh in May of this year.  Ever since that time, she has had trouble with constipation.  She has tried fiber which hasn't helped much.  She usually uses miralax to have bowel movements.  She denies bleeding.  She has occasional diarrhea with ingestion of certain foods.  Before the surgery, her BM's were always very regular.  She has had 4 vaginal deliveries.  She tore with at least one of these.    Past Medical History  Diagnosis Date  . GERD (gastroesophageal reflux disease)   . MVP (mitral valve prolapse)   . 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 Frequency of urination   . Urgency of urination   . Stress incontinence   . SOB (shortness of breath) on exertion   . Migraines   . OSA (obstructive sleep apnea) INTOLERANT CPAP   . Diabetes mellitus without complication   . Heart murmur        Past Surgical History  Procedure Date  . Total abdominal hysterectomy w/ bilateral salpingoophorectomy 1990  . Tonsillectomy and adenoidectomy AGE 26  . Pubovaginal sling 08/25/2011    Procedure: PUBO-VAGINAL SLING;  Surgeon: Sigmund I Tannenbaum, MD;  Location: [facility];  Service: Urology;  Laterality: N/A;  [employer] SCI[employer]IFIC MESH UPHOLD LITE ANTERIOR VAULT REPAIR   . Abdominal hysterectomy   . Bladder suspension       Current Outpatient Prescriptions  Medication Sig Dispense Refill  . aspirin-acetaminophen-caffeine (EXCEDRIN MIGRAINE) 250-250-65 MG per tablet Take 1 tablet by mouth every 6 (six) hours as needed.      . ATENOLOL PO Take 10 mg by mouth every evening.       . atorvastatin (LIPITOR) 20 MG tablet       . diclofenac (VOLTAREN) 75 MG EC tablet       . L-Methylfolate-B6-B12 (METANX PO) Take by mouth.      . metFORMIN (GLUCOPHAGE) 500 MG tablet  Take 500 mg by mouth 2 (two) times daily with a meal.      . Multiple Vitamin (MULTIVITAMIN) tablet Take 1 tablet by mouth daily.      . NON FORMULARY chek test voice - glucose test strips      . NON FORMULARY lancets      . omeprazole (PRILOSEC) 20 MG capsule       . OVER THE COUNTER MEDICATION Take by mouth daily. ST JOHN'S WART SUPPLE MENT      . vitamin E 400 UNIT capsule Take 400 Units by mouth daily.         No Known Allergies    Family History  Problem Relation Age of Onset  . Cancer Mother     lung  . Alcohol abuse Father       History   Social History  . Marital Status: Divorced    Spouse Name: N/A    Number of Children: N/A  . Years of Education: N/A   Social History Main Topics  . Smoking status: Never Smoker   . Smokeless tobacco: Never Used  . Alcohol Use: No  . Drug Use: No  . Sexually Active: None   Other Topics Concern  . None   Social History Narrative  . None         REVIEW OF SYSTEMS - PERTINENT POSITIVES ONLY: Review of Systems - General ROS: negative for - chills or fever Hematological and Lymphatic ROS: negative for - bleeding problems or blood clots Respiratory ROS: no cough, shortness of breath, or wheezing Cardiovascular ROS: no chest pain or dyspnea on exertion Gastrointestinal ROS: no abdominal pain, change in bowel habits, or black or bloody stools Genito-Urinary ROS: no dysuria, trouble voiding, or hematuria  EXAM: Filed Vitals:   02/22/12 0941  BP: 124/70  Pulse: 76  Temp: 97.2 F (36.2 C)  Resp: 20    General appearance: alert and cooperative GI: soft, non-tender; bowel sounds normal; no masses,  no organomegaly  Procedure: Anoscopy Surgeon: Mael Delap Assistant: Glaspey After the risks and benefits were explained, verbal consent was obtained for above procedure  Anesthesia: none Diagnosis: fecal leakage Findings: weak squeeze, good push, good tone, small rectocele   ASSESSMENT AND PLAN: Kristin Farley is a 66 y.o. F  who presents to me with constipation after bladder surgery and mild fecal incontinence.  She sees the constipation as the biggest problem.  I have recommended that she continue the high fiber diet and miralax as needed.  This is most likely due to her surgical repair and her rectum being in a slightly different configuration after surgery.  It appears this is improving with her current regimen.  Given new onset constipation and the fact that she has never had a colonoscopy, I have recommended that we perform this as the next step in treatment.  If she is still having trouble with the incontinence after that, we will get some anal manometry.  If she is still having trouble with her constipation, we will get a MR defecography.    Selby Slovacek C Dakari Cregger, MD Colon and Rectal Surgery / General Surgery [facility]      Visit Diagnoses: 1. Constipation   2. Fecal incontinence     Primary Care Physician: SHAH,ASHISH, MD    

## 2012-02-22 NOTE — Patient Instructions (Addendum)
GETTING TO GOOD BOWEL HEALTH. Irregular bowel habits such as constipation can lead to many problems over time.  Having one soft bowel movement a day is the most important way to prevent further problems.  The anorectal canal is designed to handle stretching and feces to safely manage our ability to get rid of solid waste (feces, poop, stool) out of our body.  BUT, hard constipated stools can act like ripping concrete bricks causing inflamed hemorrhoids, anal fissures, abdominal pain and bloating.     The goal: ONE SOFT BOWEL MOVEMENT A DAY!  To have soft, regular bowel movements:    Drink at least 8 tall glasses of water a day.     Take plenty of fiber.  Fiber is the undigested part of plant food that passes into the colon, acting s "natures broom" to encourage bowel motility and movement.  Fiber can absorb and hold large amounts of water. This results in a larger, bulkier stool, which is soft and easier to pass. Work gradually over several weeks up to 6 servings a day of fiber (25g a day even more if needed) in the form of: o Vegetables -- Root (potatoes, carrots, turnips), leafy green (lettuce, salad greens, celery, spinach), or cooked high residue (cabbage, broccoli, etc) o Fruit -- Fresh (unpeeled skin & pulp), Dried (prunes, apricots, cherries, etc ),  or stewed ( applesauce)  o Whole grain breads, pasta, etc (whole wheat)  o Bran cereals    Bulking Agents -- This type of water-retaining fiber generally is easily obtained each day by one of the following:  o Psyllium bran -- The psyllium plant is remarkable because its ground seeds can retain so much water. This product is available as Metamucil, Konsyl, Effersyllium, Per Diem Fiber, or the less expensive generic preparation in drug and health food stores. Although labeled a laxative, it really is not a laxative.  o Methylcellulose -- This is another fiber derived from wood which also retains water. It is available as Citrucel. o Polyethylene Glycol  - and "artificial" fiber commonly called Miralax or Glycolax.  It is helpful for people with gassy or bloated feelings with regular fiber o Flax Seed - a less gassy fiber than psyllium   No reading or other relaxing activity while on the toilet. If bowel movements take longer than 5 minutes, you are too constipated   AVOID CONSTIPATION.  High fiber and water intake usually takes care of this.  Sometimes a laxative is needed to stimulate more frequent bowel movements, but    Laxatives are not a good long-term solution as it can wear the colon out. o Osmotics (Milk of Magnesia, Fleets phosphosoda, Magnesium citrate, MiraLax, GoLytely) are safer than  o Stimulants (Senokot, Castor Oil, Dulcolax, Ex Lax)    o Do not take laxatives for more than 7days in a row.    IF SEVERELY CONSTIPATED, try a Bowel Retraining Program: o Do not use laxatives.  o Eat a diet high in roughage, such as bran cereals and leafy vegetables.  o Drink six (6) ounces of prune or apricot juice each morning.  o Eat two (2) large servings of stewed fruit each day.  o Take one (1) heaping tablespoon of a psyllium-based bulking agent twice a day. Use sugar-free sweetener when possible to avoid excessive calories.  o Eat a normal breakfast.  o Set aside 15 minutes after breakfast to sit on the toilet, but do not strain to have a bowel movement.  o If you do   not have a bowel movement by the third day, use an enema and repeat the above steps.       [facility]  ONE-DAY (1) PRE-OP HOME COLON PREP INSTRUCTIONS: ** MIRALAX / GATORADE PREP **  You must follow the instructions below carefully.  If you have questions or problems, please call and speak to someone in the clinic department at our office:   [phone].     INSTRUCTIONS: 1. Five days prior to your procedure do not eat nuts, popcorn, or fruit with seeds.  Stop all fiber supplements such as Metamucil, Citrucel, etc. 2. 3 days before procedure fill the  prescription at a pharmacy of your choice and purchase the additional supplies below.         MIRALAX - GATORADE -- DULCOLAX TABS:   Purchase a bottle of MIRALAX  (255 gm bottle)    In addition, purchase four (4) DULCOLAX TABLETS (no prescription required- ask the pharmacist if you can't find them)    Purchase one 64 oz GATORADE.  (Do NOT purchase red Gatorade; any other flavor is acceptable) and place in refrigerator to get cold.   2 days before procedure   Only drink liquids, no solid foods    Drink 1/2 of the Gatorade and miralax bottle   3.   Day Before Surgery:   6 am: take the 4 Dulcolax tablets   You may only have clear liquids (tea, coffee, juice, broth, jello, soft drinks, gummy bears).  You cannot have solid foods, cream, milk or milk products.  Drink at lease 8 ounces of liquids every hour while awake.   Mix the entire bottle of MiraLax and the Gatorade in a large container.    10:00am: Begin drinking the Gatorade mixture until gone (8 oz every 15-30 minutes).      You may suck on a lime wedge or hard candy to "freshen your palate" in between glasses   If you are a diabetic, take your blood sugar reading several time throughout the prep.  Have some juice available to take if your sugar level gets too low   You may feel chilled while taking the prep.  Have some warm tea or broth to help warm up.   Continue clear liquids until midnight or bedtime  3. The day of your procedure:   Do not eat or drink ANYTHING after midnight before your surgery.     If you take Heart or Blood Pressure medicine, ask the pre-op nurses about these during your preop appointment.   Further pre-operative instructions will be given to you from the hospital.   Expect to be contacted 5-7 days before your surgery.

## 2012-03-14 ENCOUNTER — Encounter (HOSPITAL_COMMUNITY): Payer: Self-pay | Admitting: Pharmacy Technician

## 2012-03-19 ENCOUNTER — Encounter (HOSPITAL_COMMUNITY): Payer: Self-pay | Admitting: Pharmacy Technician

## 2012-03-22 ENCOUNTER — Ambulatory Visit (HOSPITAL_COMMUNITY): Payer: Medicare Other

## 2012-03-22 ENCOUNTER — Ambulatory Visit (HOSPITAL_COMMUNITY)
Admission: RE | Admit: 2012-03-22 | Discharge: 2012-03-22 | Disposition: A | Discharge status: Home / Self Care | Payer: Medicare Other | Source: Ambulatory Visit | Attending: General Surgery | Admitting: General Surgery

## 2012-03-22 ENCOUNTER — Encounter (HOSPITAL_COMMUNITY)
Admission: RE | Disposition: A | Discharge status: Home / Self Care | Payer: Self-pay | Source: Ambulatory Visit | Attending: General Surgery

## 2012-03-22 ENCOUNTER — Encounter (HOSPITAL_COMMUNITY): Payer: Self-pay | Admitting: *Deleted

## 2012-03-22 DIAGNOSIS — G4733 Obstructive sleep apnea (adult) (pediatric): Secondary | ICD-10-CM | POA: Insufficient documentation

## 2012-03-22 DIAGNOSIS — K5900 Constipation, unspecified: Secondary | ICD-10-CM | POA: Insufficient documentation

## 2012-03-22 DIAGNOSIS — E119 Type 2 diabetes mellitus without complications: Secondary | ICD-10-CM | POA: Insufficient documentation

## 2012-03-22 DIAGNOSIS — R159 Full incontinence of feces: Secondary | ICD-10-CM | POA: Insufficient documentation

## 2012-03-22 DIAGNOSIS — Z79899 Other long term (current) drug therapy: Secondary | ICD-10-CM | POA: Insufficient documentation

## 2012-03-22 DIAGNOSIS — Z1211 Encounter for screening for malignant neoplasm of colon: Secondary | ICD-10-CM

## 2012-03-22 DIAGNOSIS — K219 Gastro-esophageal reflux disease without esophagitis: Secondary | ICD-10-CM | POA: Insufficient documentation

## 2012-03-22 HISTORY — PX: COLONOSCOPY: SHX5424

## 2012-03-22 LAB — GLUCOSE, CAPILLARY: Glucose-Capillary: 126 mg/dL — ABNORMAL HIGH (ref 70–99)

## 2012-03-22 SURGERY — COLONOSCOPY
Anesthesia: Moderate Sedation

## 2012-03-22 MED ORDER — MIDAZOLAM HCL 10 MG/2ML IJ SOLN
INTRAMUSCULAR | Status: DC | PRN
  Administered 2012-03-22 (×4): 2.5 mg via INTRAVENOUS

## 2012-03-22 MED ORDER — ACETAMINOPHEN 325 MG PO TABS
650.0000 mg | ORAL_TABLET | ORAL | Status: DC | PRN
  Filled 2012-03-22: qty 2

## 2012-03-22 MED ORDER — SODIUM CHLORIDE 0.9 % IJ SOLN: 3.0000 mL | INTRAMUSCULAR | Status: DC | PRN

## 2012-03-22 MED ORDER — ACETAMINOPHEN 650 MG RE SUPP
650.0000 mg | RECTAL | Status: DC | PRN
  Filled 2012-03-22: qty 1

## 2012-03-22 MED ORDER — DIPHENHYDRAMINE HCL 50 MG/ML IJ SOLN
INTRAMUSCULAR | Status: AC
  Filled 2012-03-22: qty 1

## 2012-03-22 MED ORDER — ONDANSETRON HCL 4 MG/2ML IJ SOLN: 4.0000 mg | Freq: Four times a day (QID) | INTRAMUSCULAR | Status: DC | PRN

## 2012-03-22 MED ORDER — SODIUM CHLORIDE 0.9 % IV SOLN
INTRAVENOUS | Status: DC
  Administered 2012-03-22: 500 mL via INTRAVENOUS

## 2012-03-22 MED ORDER — FENTANYL CITRATE 0.05 MG/ML IJ SOLN
INTRAMUSCULAR | Status: AC
  Filled 2012-03-22: qty 4

## 2012-03-22 MED ORDER — MIDAZOLAM HCL 10 MG/2ML IJ SOLN
INTRAMUSCULAR | Status: AC
  Filled 2012-03-22: qty 4

## 2012-03-22 MED ORDER — SODIUM CHLORIDE 0.9 % IV SOLN: 250.0000 mL | INTRAVENOUS | Status: DC | PRN

## 2012-03-22 MED ORDER — SODIUM CHLORIDE 0.9 % IJ SOLN: 3.0000 mL | Freq: Two times a day (BID) | INTRAMUSCULAR | Status: DC

## 2012-03-22 MED ORDER — FENTANYL CITRATE 0.05 MG/ML IJ SOLN
INTRAMUSCULAR | Status: DC | PRN
  Administered 2012-03-22 (×4): 25 ug via INTRAVENOUS

## 2012-03-22 NOTE — Progress Notes (Signed)
1432 Pt is being Dc'd to  Endoscopy to radiology for a Barium Enema

## 2012-03-22 NOTE — H&P (View-Only) (Signed)
Chief Complaint  Patient presents with  . New Evaluation    eval of fecal incontinence    HISTORY:  Kristin Farley is a 66 y.o. female who presents to clinic with fecal incontinence.  She is s/p colpopexy with mesh in May of this year.  Ever since that time, she has had trouble with constipation.  She has tried fiber which hasn't helped much.  She usually uses miralax to have bowel movements.  She denies bleeding.  She has occasional diarrhea with ingestion of certain foods.  Before the surgery, her BM's were always very regular.  She has had 4 vaginal deliveries.  She tore with at least one of these.    Past Medical History  Diagnosis Date  . GERD (gastroesophageal reflux disease)   . MVP (mitral valve prolapse)   . 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 Frequency of urination   . Urgency of urination   . Stress incontinence   . SOB (shortness of breath) on exertion   . Migraines   . OSA (obstructive sleep apnea) INTOLERANT CPAP   . Diabetes mellitus without complication   . Heart murmur        Past Surgical History  Procedure Date  . Total abdominal hysterectomy w/ bilateral salpingoophorectomy 1990  . Tonsillectomy and adenoidectomy AGE 68  . Pubovaginal sling 08/25/2011    Procedure: Kristin Farley;  Surgeon: Kristin Ludwig, MD;  Location: [facility];  Service: Urology;  Laterality: N/A;  [place] SCIENTIFIC MESH UPHOLD LITE ANTERIOR VAULT REPAIR   . Abdominal hysterectomy   . Bladder suspension       Current Outpatient Prescriptions  Medication Sig Dispense Refill  . aspirin-acetaminophen-caffeine (EXCEDRIN MIGRAINE) 250-250-65 MG per tablet Take 1 tablet by mouth every 6 (six) hours as needed.      . ATENOLOL PO Take 10 mg by mouth every evening.       Marland Kitchen atorvastatin (LIPITOR) 20 MG tablet       . diclofenac (VOLTAREN) 75 MG EC tablet       . L-Methylfolate-B6-B12 (METANX PO) Take by mouth.      . metFORMIN (GLUCOPHAGE) 500 MG tablet  Take 500 mg by mouth 2 (two) times daily with a meal.      . Multiple Vitamin (MULTIVITAMIN) tablet Take 1 tablet by mouth daily.      . NON FORMULARY chek test voice - glucose test strips      . NON FORMULARY lancets      . omeprazole (PRILOSEC) 20 MG capsule       . OVER THE COUNTER MEDICATION Take by mouth daily. ST JOHN'S WART SUPPLE MENT      . vitamin E 400 UNIT capsule Take 400 Units by mouth daily.         No Known Allergies    Family History  Problem Relation Age of Onset  . Cancer Mother     lung  . Alcohol abuse Father       History   Social History  . Marital Status: Divorced    Spouse Name: N/A    Number of Children: N/A  . Years of Education: N/A   Social History Main Topics  . Smoking status: Never Smoker   . Smokeless tobacco: Never Used  . Alcohol Use: No  . Drug Use: No  . Sexually Active: None   Other Topics Concern  . None   Social History Narrative  . None  REVIEW OF SYSTEMS - PERTINENT POSITIVES ONLY: Review of Systems - General ROS: negative for - chills or fever Hematological and Lymphatic ROS: negative for - bleeding problems or blood clots Respiratory ROS: no cough, shortness of breath, or wheezing Cardiovascular ROS: no chest pain or dyspnea on exertion Gastrointestinal ROS: no abdominal pain, change in bowel habits, or black or bloody stools Genito-Urinary ROS: no dysuria, trouble voiding, or hematuria  EXAM: Filed Vitals:   02/22/12 0941  BP: 124/70  Pulse: 76  Temp: 97.2 F (36.2 C)  Resp: 20    General appearance: alert and cooperative GI: soft, non-tender; bowel sounds normal; no masses,  no organomegaly  Procedure: Anoscopy Surgeon: Kristin Farley Assistant: Kristin Farley After the risks and benefits were explained, verbal consent was obtained for above procedure  Anesthesia: none Diagnosis: fecal leakage Findings: weak squeeze, good push, good tone, small rectocele   ASSESSMENT AND PLAN: Kristin Farley is a 66 y.o. F  who presents to me with constipation after bladder surgery and mild fecal incontinence.  She sees the constipation as the biggest problem.  I have recommended that she continue the high fiber diet and miralax as needed.  This is most likely due to her surgical repair and her rectum being in a slightly different configuration after surgery.  It appears this is improving with her current regimen.  Given new onset constipation and the fact that she has never had a colonoscopy, I have recommended that we perform this as the next step in treatment.  If she is still having trouble with the incontinence after that, we will get some anal manometry.  If she is still having trouble with her constipation, we will get a MR defecography.    Kristin Panda, MD Colon and Rectal Surgery / General Surgery [facility]      Visit Diagnoses: 1. Constipation   2. Fecal incontinence     Primary Care Physician: Kristin Peri, MD

## 2012-03-22 NOTE — Op Note (Signed)
Preop Diagnosis: screening for colon cancer  Post op Diagnosis: same  Procedure: screening colonoscopy  Indications: this patient is 66 years old and has never had a screening colonoscopy.  Description: the patient was identified in the endoscopy suite and a procedure timeout was performed indicating the correct patient and procedure. Sedation was performed after a preprocedure assessment was complete. Once the patient was adequately sedated I began with a digital rectal exam. There were no masses outdated. I inserted the scope rectally and proceeded to advance throughout the colon. Her sigmoid colon was extremely torturous and long. It was difficult to get through this area. I was able to get past the splenic flexure into the transverse colon but was unable to proceed any further due to significant looping and a torturous colon. We tried multiple positions, including supine and right and left lateral. We tried multiple points of pressure and were unable to advance the scope any further. After about an hour of trying I decided to abort the procedure. The colon that I was able to examine from the splenic flexure down appeared to be normal mucosa. There were no polyps identified. The patient was sent for a barium enema after the procedure to evaluate the right side of her colon. The patient awakened from the sedation well. There were no immediate complications.

## 2012-03-22 NOTE — Interval H&P Note (Signed)
History and Physical Interval Note:  03/22/2012 12:21 PM  Kristin Farley  has presented today for surgery, with the diagnosis of screening  The various methods of treatment have been discussed with the patient and family. After consideration of risks, benefits and other options for treatment, the patient has consented to  Procedure(s) (LRB) with comments: COLONOSCOPY (N/A) - colonoscopy with sedation as a surgical intervention .  The patient's history has been reviewed, patient examined, no change in status, stable for surgery.  I have reviewed the patient's chart and labs.  Questions were answered to the patient's satisfaction.     [name].

## 2012-03-25 ENCOUNTER — Encounter (HOSPITAL_COMMUNITY): Payer: Self-pay | Admitting: General Surgery

## 2012-04-09 ENCOUNTER — Encounter (INDEPENDENT_AMBULATORY_CARE_PROVIDER_SITE_OTHER): Payer: Medicare Other | Admitting: General Surgery

## 2012-04-19 ENCOUNTER — Encounter (INDEPENDENT_AMBULATORY_CARE_PROVIDER_SITE_OTHER): Payer: Self-pay | Admitting: General Surgery

## 2012-05-07 ENCOUNTER — Encounter (INDEPENDENT_AMBULATORY_CARE_PROVIDER_SITE_OTHER): Payer: Medicare Other | Admitting: General Surgery

## 2012-05-13 ENCOUNTER — Encounter (INDEPENDENT_AMBULATORY_CARE_PROVIDER_SITE_OTHER): Payer: Medicare Other | Admitting: General Surgery

## 2012-05-16 ENCOUNTER — Encounter (INDEPENDENT_AMBULATORY_CARE_PROVIDER_SITE_OTHER): Payer: Self-pay | Admitting: General Surgery

## 2012-05-17 ENCOUNTER — Telehealth (INDEPENDENT_AMBULATORY_CARE_PROVIDER_SITE_OTHER): Payer: Self-pay

## 2012-05-17 NOTE — Telephone Encounter (Signed)
I called to let the pt know that there is only a 2 week global period after her colonoscopy.  She doesn't need to see Dr Maisie Fus because the original appointment was a follow up of symptoms.  Dr Maisie Fus sent her a letter stating the colonoscopy was negative.  The pt states she rec'd the letter.

## 2013-11-23 IMAGING — CR DG BE W/ CM - WO/W KUB
15 of 19 series · 15 of 19 positions shown · non-contrast
Comparison: None.

CLINICAL DATA: Incomplete colonoscopy.

SINGLE COLUMN BARIUM ENEMA
TECHNIQUE: Initial scout AP supine abdominal image obtained to
insure adequate colon cleansing.  Barium was introduced into the
colon in a retrograde fashion and refluxed from the rectum to the
cecum. Spot images of the colon followed by overhead radiographs
were obtained.
Fluoroscopy time: 2.37 minutes slow pulsed fluoroscopy.

[run (1 of 13)]
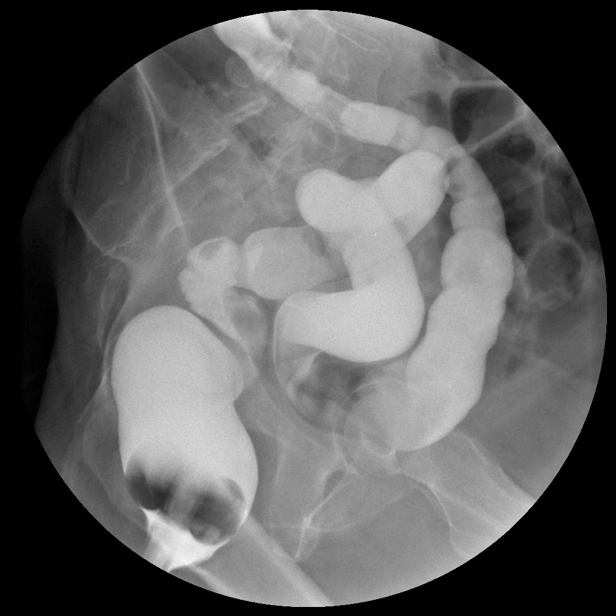

[run (2 of 13)]
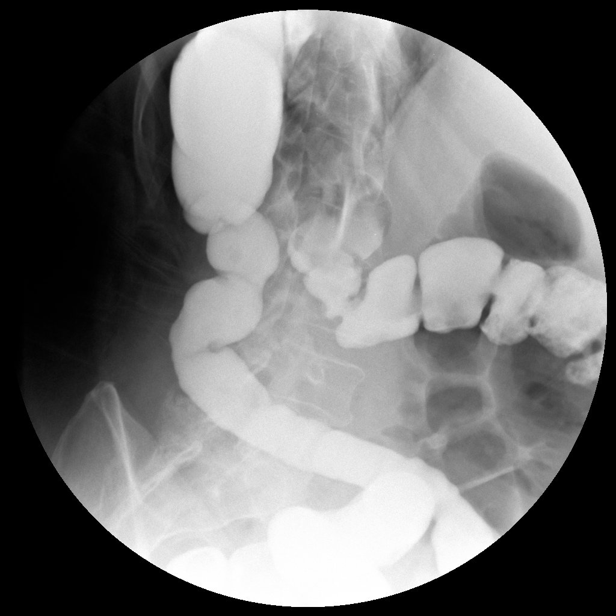

[run (3 of 13)]
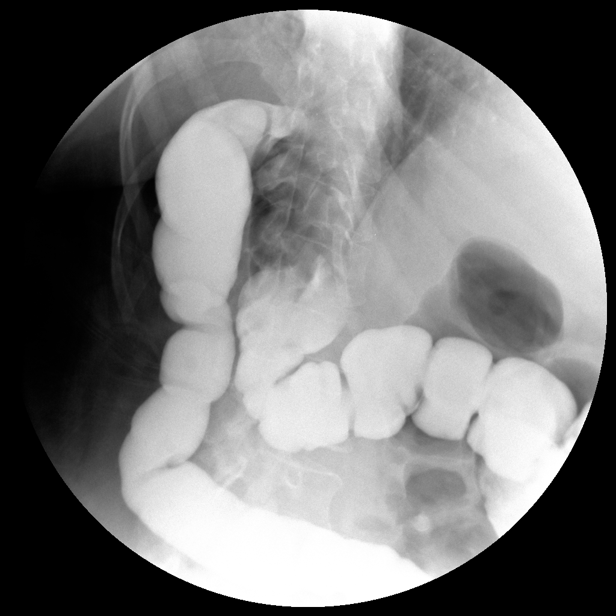

[run (4 of 13)]
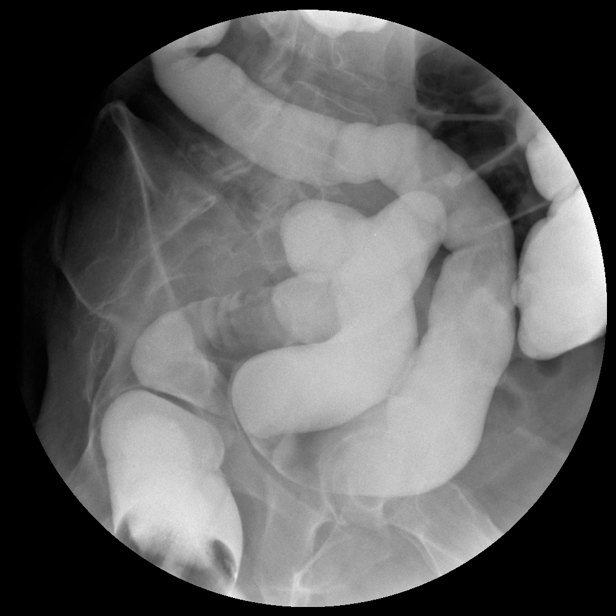

[run (5 of 13)]
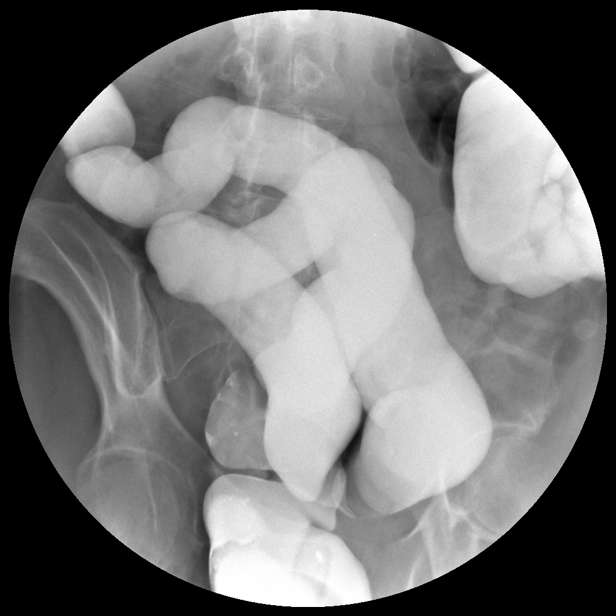

[run (6 of 13)]
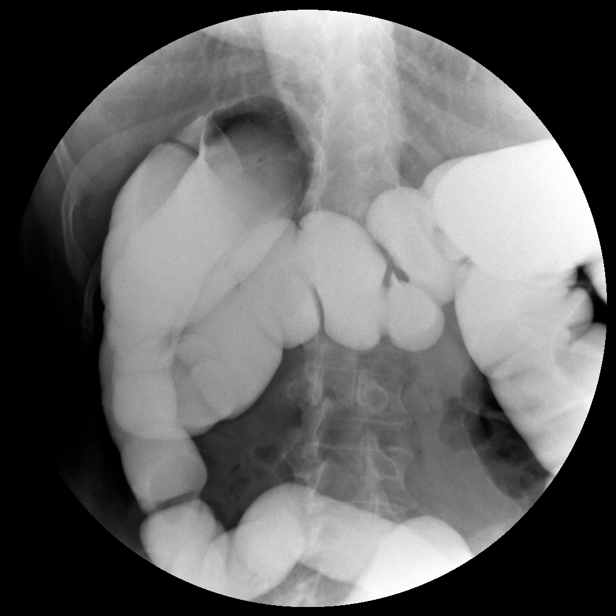

[run (7 of 13)]
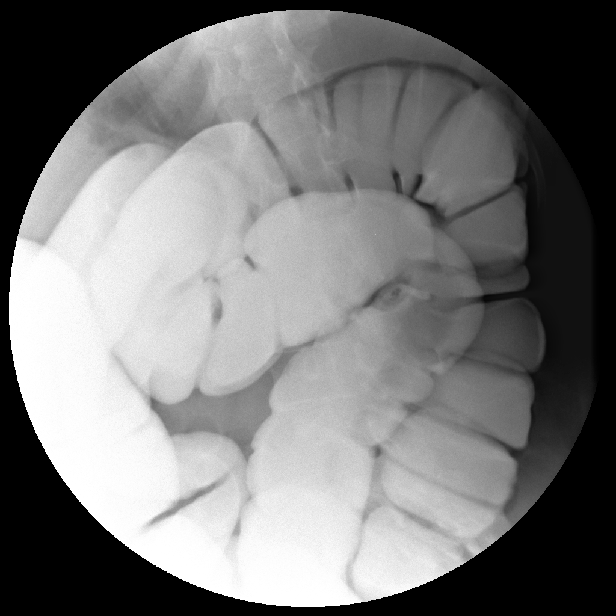

[run (8 of 13)]
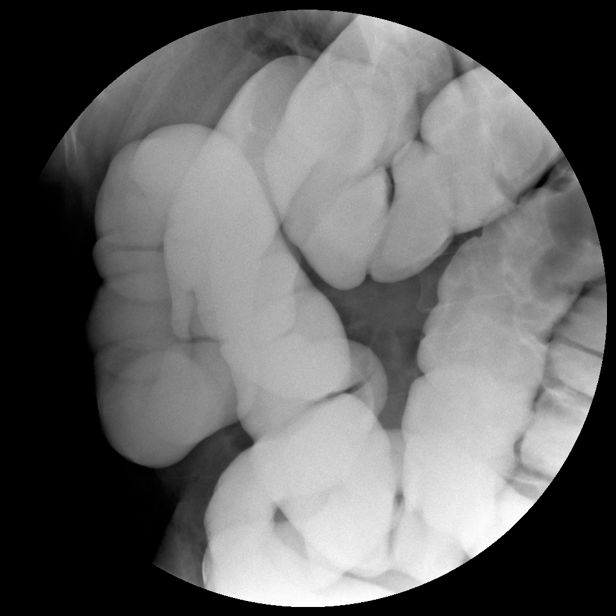

[run (9 of 13)]
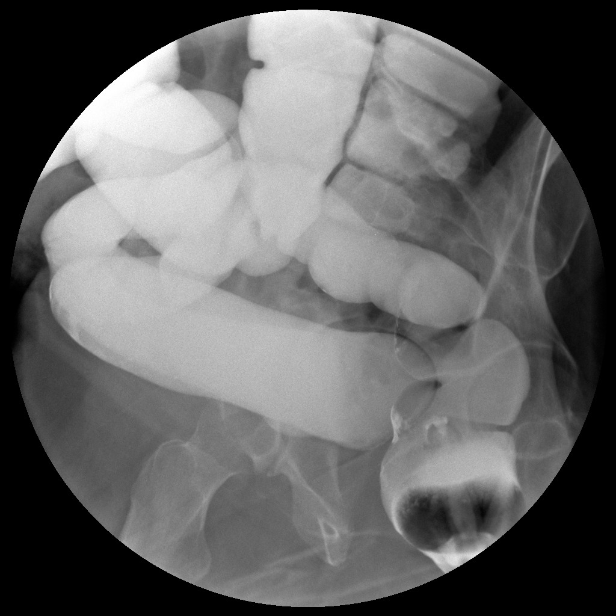

[run (10 of 13)]
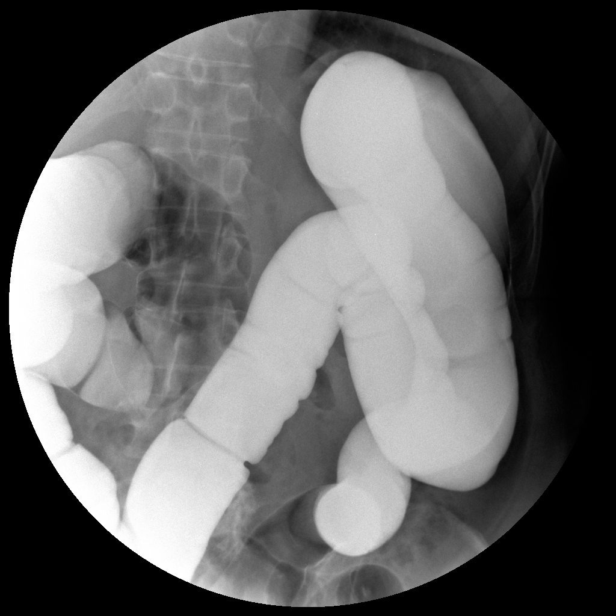

[run (11 of 13)]
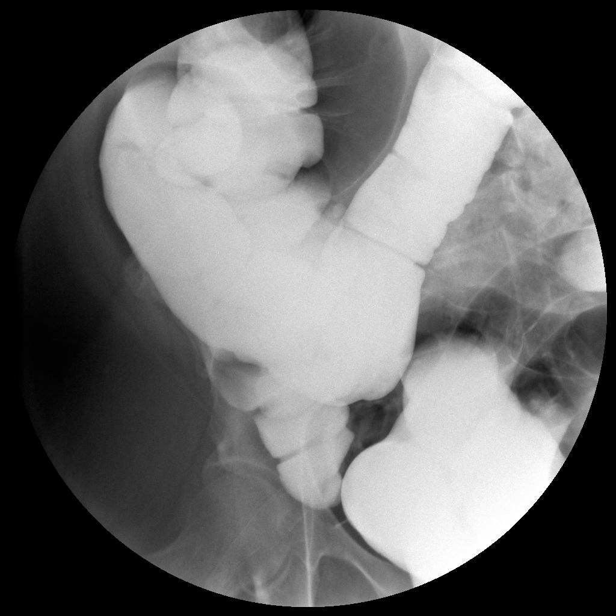

[run (12 of 13)]
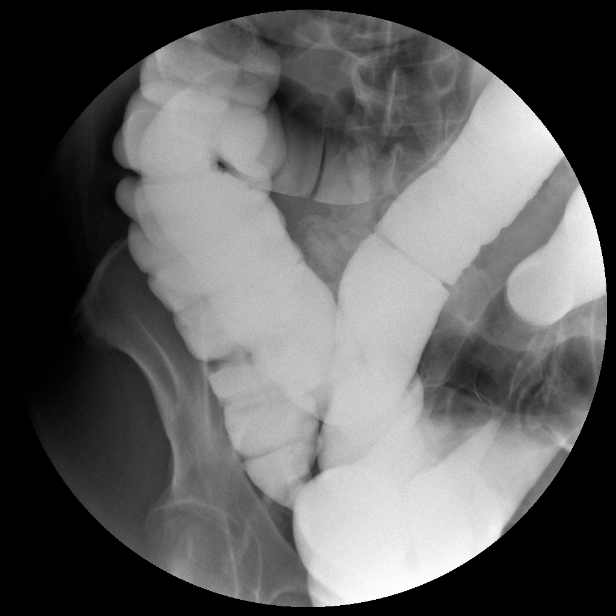

[run (13 of 13)]
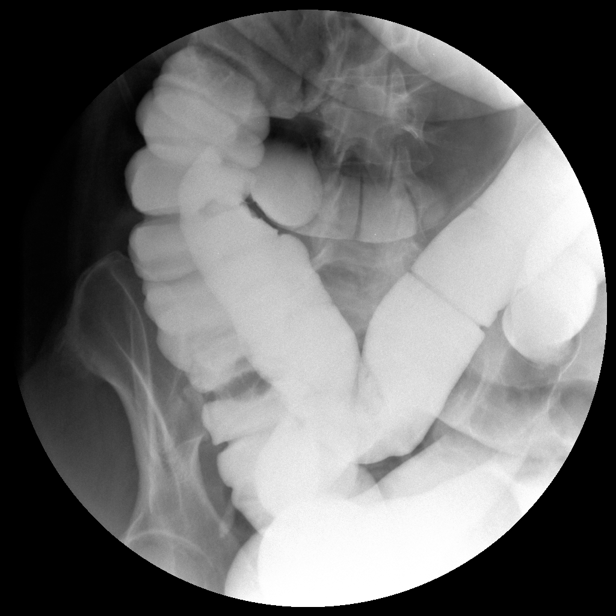

[view not recorded (1 of 2)]
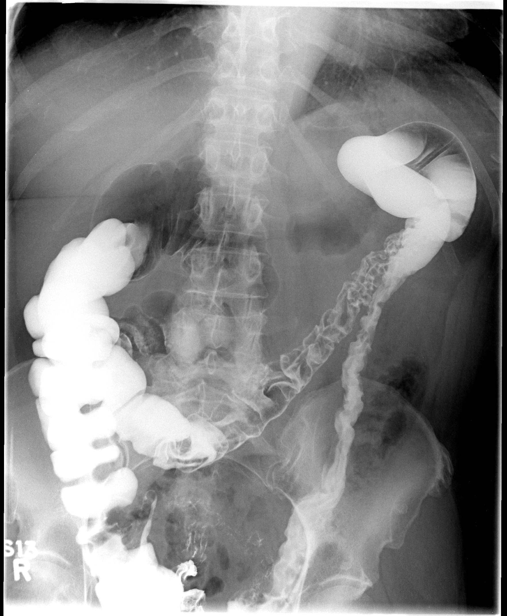

[view not recorded (2 of 2)]
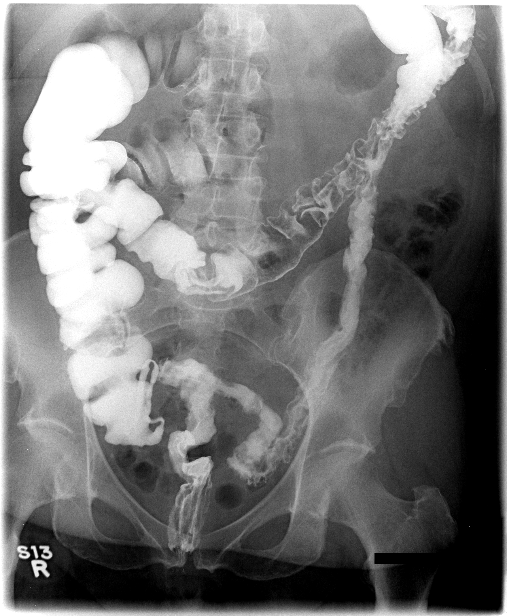

[15 of 19 positions shown; findings below may reference images not displayed]

FINDINGS: The scout image demonstrates a moderate amount of air in
the colon and small bowel.  No dilated loops of bowel.
Degenerative arthritic changes in the lower lumbar spine.

The colon was filled in the normal retrograde manner.  The appendix
filled.  There is no reflux into the terminal ileum.

The patient has a redundant but otherwise normal appearing colon.

There are no mass lesions or polyps or other significant
abnormalities.  No strictures or diverticula.
IMPRESSION: Normal full column barium enema including the appendix.

## 2014-07-14 ENCOUNTER — Encounter: Payer: Self-pay | Admitting: Internal Medicine

## 2014-07-27 ENCOUNTER — Ambulatory Visit: Payer: Self-pay | Admitting: Nurse Practitioner

## 2014-08-26 ENCOUNTER — Ambulatory Visit: Payer: Self-pay | Admitting: Nurse Practitioner

## 2014-10-01 ENCOUNTER — Encounter: Payer: Self-pay | Admitting: Gastroenterology

## 2014-10-01 ENCOUNTER — Ambulatory Visit (INDEPENDENT_AMBULATORY_CARE_PROVIDER_SITE_OTHER): Payer: Medicare Other | Admitting: Gastroenterology

## 2014-10-01 VITALS — BP 133/80 | HR 79 | Temp 98.0°F | Ht 64.0 in | Wt 191.0 lb

## 2014-10-01 DIAGNOSIS — K5901 Slow transit constipation: Secondary | ICD-10-CM | POA: Insufficient documentation

## 2014-10-01 NOTE — Progress Notes (Signed)
cc'ed to pcp °

## 2014-10-01 NOTE — Progress Notes (Signed)
Subjective:    Patient ID: Kristin Farley, female    DOB: 1945-08-01, 69 y.o.   MRN: [id number]  Kristin Peri, MD  HPI HAS EXCESSIVE GAS SINCE BLADDER SURGERY ([place]) AND TROUBLE WITH HER BOWELS/CHANGE IN HER BOWL HABIT SINCE 2013. WENT BACK TO UROLOGY AND TRIED MEDS. Bm: 3x/week. IF TAKES LAXATIVE GETS A COMPLETE EMPTYING BUT MAY HAVE ACCIDENTS.  WAS SICK AFTER THE TCS 2013: FEVER/CHILLS. MEDS TRIED FOR BOWEL PROBLEMS: MIRALAX, FIBERLAX??. EATS WHEAT BREAD ALL THE TIME/EVERY DAY. STARTED PROBIOTIC GUMMIES DAILY. GAS A LITTLE BETTER. GERD CONTROLLED ON PRILOSEC.    PT DENIES FEVER, CHILLS, HEMATOCHEZIA, nausea, vomiting, melena, CHEST PAIN, SHORTNESS OF BREATH,  abdominal pain, problems swallowing, OR, heartburn or indigestion.   Past Medical History  Diagnosis Date  . GERD (gastroesophageal reflux disease)   . MVP (mitral valve prolapse)   . 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 Frequency of urination   . Urgency of urination   . Stress incontinence   . SOB (shortness of breath) on exertion   . Migraines   . OSA (obstructive sleep apnea) INTOLERANT CPAP   . Diabetes mellitus without complication   . Heart murmur    Past Surgical History  Procedure Laterality Date  . Total abdominal hysterectomy w/ bilateral salpingoophorectomy  1990  . Tonsillectomy and adenoidectomy  AGE 56  . Pubovaginal sling  08/25/2011    Procedure: Leonides Grills;  Surgeon: Kathi Ludwig, MD;  Location: [facility];  Service: Urology;  Laterality: N/A;  [place] SCIENTIFIC MESH UPHOLD LITE ANTERIOR VAULT REPAIR   . Abdominal hysterectomy    . Bladder suspension    . Colonoscopy  03/22/2012    Procedure: COLONOSCOPY;  Surgeon: Romie Levee, MD;  Location: [facility];  Service: Endoscopy;  Laterality: N/A;  colonoscopy with sedation   No Known Allergies  Current Outpatient Prescriptions  Medication Sig Dispense Refill  . aspirin 81 MG tablet Take 81 mg by mouth  daily.    Marland Kitchen aspirin-acetaminophen-caffeine (EXCEDRIN MIGRAINE) 250-250-65 MG per tablet Take 1 tablet by mouth every 6 (six) hours as needed. For headache    . atenolol (TENORMIN) 25 MG tablet Take 25 mg by mouth daily.    Marland Kitchen atorvastatin (LIPITOR) 20 MG tablet Take 20 mg by mouth daily.     . benazepril (LOTENSIN) 5 MG tablet 5 mg.    . diclofenac (VOLTAREN) 75 MG EC tablet Take 75 mg by mouth 2 (two) times daily.     . fish oil-omega-3 fatty acids 1000 MG capsule Take 1 g by mouth daily.    Marland Kitchen L-Methylfolate-B6-B12 (METANX PO) Take 1 tablet by mouth daily.     . metFORMIN (GLUCOPHAGE) 500 MG tablet Take 500 mg by mouth 2 (two) times daily with a meal.    . Multiple Vitamin (MULTIVITAMIN) tablet Take 1 tablet by mouth daily.    Marland Kitchen omeprazole (PRILOSEC) 20 MG capsule Take 20 mg by mouth 2 (two) times daily.  EVERY DAY BID   . OVER THE COUNTER MEDICATION Take by mouth daily. ST JOHN'S WART SUPPLE MENT    . vitamin E 400 UNIT capsule Take 400 Units by mouth daily.      Family History  Problem Relation Age of Onset  . Cancer Mother     lung  . Alcohol abuse Father   . Colon cancer Neg Hx   . Colon polyps Neg Hx      History  Substance Use Topics  . Smoking  status: Never Smoker   . Smokeless tobacco: Never Used  . Alcohol Use: No   Review of Systems PER HPI OTHERWISE ALL SYSTEMS ARE NEGATIVE.     Objective:   Physical Exam  Constitutional: She is oriented to person, place, and time. She appears well-developed and well-nourished. No distress.  HENT:  Head: Normocephalic and atraumatic.  Mouth/Throat: Oropharynx is clear and moist. No oropharyngeal exudate.  Eyes: Pupils are equal, round, and reactive to light. No scleral icterus.  Neck: Normal range of motion. Neck supple.  Cardiovascular: Normal rate, regular rhythm and normal heart sounds.   Pulmonary/Chest: Effort normal and breath sounds normal. No respiratory distress.  Abdominal: Soft. Bowel sounds are normal. She exhibits  no distension. There is no tenderness.  Musculoskeletal: She exhibits no edema.  Lymphadenopathy:    She has no cervical adenopathy.  Neurological: She is alert and oriented to person, place, and time.  Psychiatric: She has a normal mood and affect.  Vitals reviewed.         Assessment & Plan:

## 2014-10-01 NOTE — Patient Instructions (Signed)
DRINK WATER TO KEEP YOUR URINE LIGHT YELLOW.  Eat fiber. AVOID ITEMS THAT CAUSE BLOATING & GAS. SEE INFO BELOW.  ADD LINZESS 30 MINS PRIOR TO BREAKFAST. IT MAY CAUSE EXPLOSIVE DIARRHEA.  Continue probiotic.  ADD LACTASE 2 PILLS WITH MEALS THREE TIMES A DAY.  PLEASE CALL WITH QUESTIONS OR CONCERNS.  FOLLOW UP IN 2 MOS.   BLOATING AND GAS PREVENTION  Although gas may be uncomfortable and embarrassing, it is not life-threatening. Understanding causes, ways to reduce symptoms, and treatment will help most people find some relief. Points to remember . Everyone has gas in the digestive tract. Marland Kitchen People often believe normal passage of gas to be excessive. . Gas comes from two main sources: swallowed air and normal breakdown of certain foods by harmless bacteria naturally present in the large intestine. . Many foods with carbohydrates can cause gas. Fats and proteins cause little gas. . Foods that may cause gas include o beans  o vegetables, such as broccoli, cabbage, brussels sprouts, onions, artichokes, and asparagus  o fruits, such as pears, apples, and peaches  o whole grains, such as whole wheat and bran  o soft drinks and fruit drinks  o milk and milk products, such as cheese and ice cream, and packaged foods prepared with lactose, such as bread, cereal, and salad dressing  o foods containing sorbitol, such as dietetic foods and sugar free candies and gums . The most common symptoms of gas are belching, flatulence, bloating, and abdominal pain.  . The most common ways to reduce the discomfort of gas are changing diet, taking nonprescription medicines, and reducing the amount of air swallowed. . Digestive enzymes, such as lactase supplements, actually help digest carbohydrates and may allow people to eat foods that normally cause gas.    High-Fiber Diet A high-fiber diet changes your normal diet to include more whole grains, legumes, fruits, and vegetables. Changes in the diet  involve replacing refined carbohydrates with unrefined foods. The calorie level of the diet is essentially unchanged. The Dietary Reference Intake (recommended amount) for adult males is 38 grams per day. For adult females, it is 25 grams per day. Pregnant and lactating women should consume 28 grams of fiber per day. Fiber is the intact part of a plant that is not broken down during digestion. Functional fiber is fiber that has been isolated from the plant to provide a beneficial effect in the body. PURPOSE  Increase stool bulk.   Ease and regulate bowel movements.   Lower cholesterol.  REDUCE RISK OF COLON CANCER  INDICATIONS THAT YOU NEED MORE FIBER  Constipation and hemorrhoids.   Uncomplicated diverticulosis (intestine condition) and irritable bowel syndrome.   Weight management.   As a protective measure against hardening of the arteries (atherosclerosis), diabetes, and cancer.   GUIDELINES FOR INCREASING FIBER IN THE DIET  Start adding fiber to the diet slowly. A gradual increase of about 5 more grams (2 slices of whole-wheat bread, 2 servings of most fruits or vegetables, or 1 bowl of high-fiber cereal) per day is best. Too rapid an increase in fiber may result in constipation, flatulence, and bloating.   Drink enough water and fluids to keep your urine clear or pale yellow. Water, juice, or caffeine-free drinks are recommended. Not drinking enough fluid may cause constipation.   Eat a variety of high-fiber foods rather than one type of fiber.   Try to increase your intake of fiber through using high-fiber foods rather than fiber pills or supplements that contain  small amounts of fiber.   The goal is to change the types of food eaten. Do not supplement your present diet with high-fiber foods, but replace foods in your present diet.   INCLUDE A VARIETY OF FIBER SOURCES  Replace refined and processed grains with whole grains, canned fruits with fresh fruits, and incorporate  other fiber sources. White rice, white breads, and most bakery goods contain little or no fiber.   Brown whole-grain rice, buckwheat oats, and many fruits and vegetables are all good sources of fiber. These include: broccoli, Brussels sprouts, cabbage, cauliflower, beets, sweet potatoes, white potatoes (skin on), carrots, tomatoes, eggplant, squash, berries, fresh fruits, and dried fruits.   Cereals appear to be the richest source of fiber. Cereal fiber is found in whole grains and bran. Bran is the fiber-rich outer coat of cereal grain, which is largely removed in refining. In whole-grain cereals, the bran remains. In breakfast cereals, the largest amount of fiber is found in those with "bran" in their names. The fiber content is sometimes indicated on the label.   You may need to include additional fruits and vegetables each day.   In baking, for 1 cup white flour, you may use the following substitutions:   1 cup whole-wheat flour minus 2 tablespoons.   1/2 cup white flour plus 1/2 cup whole-wheat flour.

## 2014-10-01 NOTE — Assessment & Plan Note (Signed)
Associated with fecal incontinence if she uses laxatives.   Drink water Eat fiber. AVOID ITEMS THAT CAUSE BLOATING & GAS. ADD LINZESS 30 MINS PRIOR TO BREAKFAST. IT MAY CAUSE EXPLOSIVE DIARRHEA. Continue probiotic. ADD LACTASE 2 PILLS WITH MEALS THREE TIMES A DAY. CALL WITH QUESTIONS OR CONCERNS. FOLLOW UP IN 2 MOS.

## 2014-11-24 ENCOUNTER — Ambulatory Visit: Payer: Medicare Other | Admitting: Gastroenterology

## 2016-11-27 ENCOUNTER — Ambulatory Visit (HOSPITAL_COMMUNITY)
Admission: RE | Admit: 2016-11-27 | Discharge: 2016-11-27 | Disposition: A | Discharge status: Home / Self Care | Payer: Medicare Other | Source: Ambulatory Visit | Attending: Nurse Practitioner | Admitting: Nurse Practitioner

## 2016-11-27 ENCOUNTER — Other Ambulatory Visit (HOSPITAL_COMMUNITY): Payer: Self-pay | Admitting: Nurse Practitioner

## 2016-11-27 DIAGNOSIS — M79605 Pain in left leg: Secondary | ICD-10-CM

## 2017-01-08 HISTORY — PX: KNEE ARTHROSCOPY: SHX127

## 2017-10-17 ENCOUNTER — Ambulatory Visit: Payer: Self-pay | Admitting: Physician Assistant

## 2017-10-17 NOTE — H&P (View-Only) (Signed)
TOTAL KNEE ADMISSION H&P  Patient is being admitted for left total knee arthroplasty.  Subjective:  Chief Complaint:left knee pain.  HPI: Kristin Farley, 72 y.o. female, has a history of pain and functional disability in the left knee due to arthritis and has failed non-surgical conservative treatments for greater than 12 weeks to includeNSAID's and/or analgesics, corticosteriod injections, flexibility and strengthening excercises, use of assistive devices and activity modification.  Onset of symptoms was gradual, starting >10 years ago with gradually worsening course since that time. The patient noted prior procedures on the knee to include  arthroscopy and menisectomy on the left knee(s).  Patient currently rates pain in the left knee(s) at 8 out of 10 with activity. Patient has night pain, worsening of pain with activity and weight bearing, pain that interferes with activities of daily living, pain with passive range of motion, crepitus and joint swelling.  Patient has evidence of periarticular osteophytes and joint space narrowing by imaging studies.There is no active infection.  Patient Active Problem List   Diagnosis Date Noted  . Constipation by delayed colonic transit 10/01/2014   Past Medical History:  Diagnosis Date  . Diabetes mellitus without complication   . Frequency of urination   . GERD (gastroesophageal reflux disease)   . Heart murmur   . Migraines   . MVP (mitral valve prolapse)   . OSA (obstructive sleep apnea) INTOLERANT CPAP   . SOB (shortness of breath) on exertion   . Stress incontinence   . 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 Urgency of urination     Past Surgical History:  Procedure Laterality Date  . ABDOMINAL HYSTERECTOMY    . BLADDER SUSPENSION    . COLONOSCOPY  03/22/2012    Romie Levee, MD-NL BUT INCOMPLETE  . PUBOVAGINAL SLING  08/25/2011   Procedure: Leonides Grills;  Surgeon: Kathi Ludwig, MD;  Location: [facility];  Service: Urology;  Laterality: N/A;  [employer] MESH UPHOLD LITE ANTERIOR VAULT REPAIR   . TONSILLECTOMY AND ADENOIDECTOMY  AGE 41  . TOTAL ABDOMINAL HYSTERECTOMY W/ BILATERAL SALPINGOOPHORECTOMY  1990    Current Outpatient Medications  Medication Sig Dispense Refill Last Dose  . aspirin 81 MG tablet Take 81 mg by mouth daily.   Taking  . aspirin-acetaminophen-caffeine (EXCEDRIN MIGRAINE) 250-250-65 MG per tablet Take 2 tablets by mouth daily as needed for migraine. For headache   Taking  . atenolol (TENORMIN) 25 MG tablet Take 25 mg by mouth at bedtime.    Taking  . atorvastatin (LIPITOR) 20 MG tablet Take 20 mg by mouth every evening.    Taking  . benazepril (LOTENSIN) 5 MG tablet Take 5 mg by mouth daily.    Taking  . BL [place] JOHNS WORT PO Take 300 mg by mouth every evening.     . Cholecalciferol (VITAMIN D) 2000 units tablet Take 2,000 Units by mouth daily.     . Cyanocobalamin (B-12) 2500 MCG TABS Take 2,500 mcg by mouth daily.     . diclofenac (VOLTAREN) 75 MG EC tablet Take 75 mg by mouth 2 (two) times daily.    Taking  . HYDROcodone-acetaminophen (NORCO/VICODIN) 5-325 MG tablet Take 1 tablet by mouth every 8 (eight) hours as needed for pain.  0   . l-methylfolate-B6-B12 (METANX) 3-35-2 MG TABS tablet Take 1 tablet by mouth daily.     . Melatonin 5 MG TABS Take 5 mg by mouth at bedtime.     . metFORMIN (GLUCOPHAGE)  500 MG tablet Take 500-1,000 mg by mouth See admin instructions. Take 1000 mg in the morning and 500 mg in the evening   Taking  . mirabegron ER (MYRBETRIQ) 50 MG TB24 tablet Take 50 mg by mouth daily.     Marland Kitchen omeprazole (PRILOSEC) 20 MG capsule Take 20 mg by mouth 2 (two) times daily.    Taking   No current facility-administered medications for this visit.    Allergies  Allergen Reactions  . Sulfur Nausea And Vomiting    Social History   Tobacco Use  . Smoking status: Never Smoker  . Smokeless tobacco: Never Used  Substance Use Topics  . Alcohol use:  No    Family History  Problem Relation Age of Onset  . Cancer Mother        lung  . Alcohol abuse Father   . Colon cancer Neg Hx   . Colon polyps Neg Hx      Review of Systems  Respiratory: Positive for shortness of breath.   Cardiovascular: Positive for palpitations and leg swelling.  Genitourinary: Positive for frequency and urgency.  Musculoskeletal: Positive for joint pain.  Neurological: Positive for dizziness and headaches.  All other systems reviewed and are negative.   Objective:  Physical Exam  Constitutional: She is oriented to person, place, and time. She appears well-developed and well-nourished. No distress.  HENT:  Head: Normocephalic and atraumatic.  Nose: Nose normal.  Eyes: Pupils are equal, round, and reactive to light. Conjunctivae and EOM are normal.  Neck: Normal range of motion. Neck supple.  Cardiovascular: Normal rate, regular rhythm, normal heart sounds and intact distal pulses.  Respiratory: Effort normal and breath sounds normal. No respiratory distress. She has no wheezes.  GI: Soft. Bowel sounds are normal. She exhibits no distension. There is no tenderness.  Musculoskeletal:       Left knee: She exhibits swelling and bony tenderness. She exhibits normal range of motion, no effusion, no deformity and no erythema. Tenderness found.  Lymphadenopathy:    She has no cervical adenopathy.  Neurological: She is alert and oriented to person, place, and time. No cranial nerve deficit.  Skin: Skin is warm and dry. No rash noted. No erythema.  Psychiatric: She has a normal mood and affect. Her behavior is normal.    Vital signs in last 24 hours: @VSRANGES @  Labs:   Estimated body mass index is 32.79 kg/m as calculated from the following:   Height as of 10/01/14: 5\' 4"  (1.626 m).   Weight as of 10/01/14: 86.6 kg (191 lb).   Imaging Review Plain radiographs demonstrate moderate degenerative joint disease of the left knee(s). The overall alignment  ismild varus. The bone quality appears to be good for age and reported activity level.   Preoperative templating of the joint replacement has been completed, documented, and submitted to the Operating Room personnel in order to optimize intra-operative equipment management.   Anticipated LOS equal to or greater than 2 midnights due to - Age 58 and older with one or more of the following:  - Obesity  - Expected need for hospital services (PT, OT, Nursing) required for safe  discharge  - Anticipated need for postoperative skilled nursing care or inpatient rehab  - Active co-morbidities: Diabetes OR   - Unanticipated findings during/Post Surgery: None  - Patient is a high risk of re-admission due to: None     Assessment/Plan:  End stage arthritis, left knee   The patient history, physical examination, clinical judgment  of the provider and imaging studies are consistent with end stage degenerative joint disease of the left knee(s) and total knee arthroplasty is deemed medically necessary. The treatment options including medical management, injection therapy arthroscopy and arthroplasty were discussed at length. The risks and benefits of total knee arthroplasty were presented and reviewed. The risks due to aseptic loosening, infection, stiffness, patella tracking problems, thromboembolic complications and other imponderables were discussed. The patient acknowledged the explanation, agreed to proceed with the plan and consent was signed. Patient is being admitted for inpatient treatment for surgery, pain control, PT, OT, prophylactic antibiotics, VTE prophylaxis, progressive ambulation and ADL's and discharge planning. The patient is planning to be discharged to skilled nursing facility

## 2017-10-17 NOTE — H&P (Signed)
TOTAL KNEE ADMISSION H&P  Patient is being admitted for left total knee arthroplasty.  Subjective:  Chief Complaint:left knee pain.  HPI: Kristin Farley, 72 y.o. female, has a history of pain and functional disability in the left knee due to arthritis and has failed non-surgical conservative treatments for greater than 12 weeks to includeNSAID's and/or analgesics, corticosteriod injections, flexibility and strengthening excercises, use of assistive devices and activity modification.  Onset of symptoms was gradual, starting >10 years ago with gradually worsening course since that time. The patient noted prior procedures on the knee to include  arthroscopy and menisectomy on the left knee(s).  Patient currently rates pain in the left knee(s) at 8 out of 10 with activity. Patient has night pain, worsening of pain with activity and weight bearing, pain that interferes with activities of daily living, pain with passive range of motion, crepitus and joint swelling.  Patient has evidence of periarticular osteophytes and joint space narrowing by imaging studies.There is no active infection.  Patient Active Problem List   Diagnosis Date Noted  . Constipation by delayed colonic transit 10/01/2014   Past Medical History:  Diagnosis Date  . Diabetes mellitus without complication   . Frequency of urination   . GERD (gastroesophageal reflux disease)   . Heart murmur   . Migraines   . MVP (mitral valve prolapse)   . OSA (obstructive sleep apnea) INTOLERANT CPAP   . SOB (shortness of breath) on exertion   . Stress incontinence   . 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 Urgency of urination     Past Surgical History:  Procedure Laterality Date  . ABDOMINAL HYSTERECTOMY    . BLADDER SUSPENSION    . COLONOSCOPY  03/22/2012    Romie Levee, MD-NL BUT INCOMPLETE  . PUBOVAGINAL SLING  08/25/2011   Procedure: Leonides Grills;  Surgeon: Kathi Ludwig, MD;  Location: [facility];  Service: Urology;  Laterality: N/A;  [employer] MESH UPHOLD LITE ANTERIOR VAULT REPAIR   . TONSILLECTOMY AND ADENOIDECTOMY  AGE 66  . TOTAL ABDOMINAL HYSTERECTOMY W/ BILATERAL SALPINGOOPHORECTOMY  1990    Current Outpatient Medications  Medication Sig Dispense Refill Last Dose  . aspirin 81 MG tablet Take 81 mg by mouth daily.   Taking  . aspirin-acetaminophen-caffeine (EXCEDRIN MIGRAINE) 250-250-65 MG per tablet Take 2 tablets by mouth daily as needed for migraine. For headache   Taking  . atenolol (TENORMIN) 25 MG tablet Take 25 mg by mouth at bedtime.    Taking  . atorvastatin (LIPITOR) 20 MG tablet Take 20 mg by mouth every evening.    Taking  . benazepril (LOTENSIN) 5 MG tablet Take 5 mg by mouth daily.    Taking  . BL [facility] JOHNS WORT PO Take 300 mg by mouth every evening.     . Cholecalciferol (VITAMIN D) 2000 units tablet Take 2,000 Units by mouth daily.     . Cyanocobalamin (B-12) 2500 MCG TABS Take 2,500 mcg by mouth daily.     . diclofenac (VOLTAREN) 75 MG EC tablet Take 75 mg by mouth 2 (two) times daily.    Taking  . HYDROcodone-acetaminophen (NORCO/VICODIN) 5-325 MG tablet Take 1 tablet by mouth every 8 (eight) hours as needed for pain.  0   . l-methylfolate-B6-B12 (METANX) 3-35-2 MG TABS tablet Take 1 tablet by mouth daily.     . Melatonin 5 MG TABS Take 5 mg by mouth at bedtime.     . metFORMIN (GLUCOPHAGE)  500 MG tablet Take 500-1,000 mg by mouth See admin instructions. Take 1000 mg in the morning and 500 mg in the evening   Taking  . mirabegron ER (MYRBETRIQ) 50 MG TB24 tablet Take 50 mg by mouth daily.     Marland Kitchen omeprazole (PRILOSEC) 20 MG capsule Take 20 mg by mouth 2 (two) times daily.    Taking   No current facility-administered medications for this visit.    Allergies  Allergen Reactions  . Sulfur Nausea And Vomiting    Social History   Tobacco Use  . Smoking status: Never Smoker  . Smokeless tobacco: Never Used  Substance Use Topics  . Alcohol use:  No    Family History  Problem Relation Age of Onset  . Cancer Mother        lung  . Alcohol abuse Father   . Colon cancer Neg Hx   . Colon polyps Neg Hx      Review of Systems  Respiratory: Positive for shortness of breath.   Cardiovascular: Positive for palpitations and leg swelling.  Genitourinary: Positive for frequency and urgency.  Musculoskeletal: Positive for joint pain.  Neurological: Positive for dizziness and headaches.  All other systems reviewed and are negative.   Objective:  Physical Exam  Constitutional: She is oriented to person, place, and time. She appears well-developed and well-nourished. No distress.  HENT:  Head: Normocephalic and atraumatic.  Nose: Nose normal.  Eyes: Pupils are equal, round, and reactive to light. Conjunctivae and EOM are normal.  Neck: Normal range of motion. Neck supple.  Cardiovascular: Normal rate, regular rhythm, normal heart sounds and intact distal pulses.  Respiratory: Effort normal and breath sounds normal. No respiratory distress. She has no wheezes.  GI: Soft. Bowel sounds are normal. She exhibits no distension. There is no tenderness.  Musculoskeletal:       Left knee: She exhibits swelling and bony tenderness. She exhibits normal range of motion, no effusion, no deformity and no erythema. Tenderness found.  Lymphadenopathy:    She has no cervical adenopathy.  Neurological: She is alert and oriented to person, place, and time. No cranial nerve deficit.  Skin: Skin is warm and dry. No rash noted. No erythema.  Psychiatric: She has a normal mood and affect. Her behavior is normal.    Vital signs in last 24 hours: @VSRANGES @  Labs:   Estimated body mass index is 32.79 kg/m as calculated from the following:   Height as of 10/01/14: 5\' 4"  (1.626 m).   Weight as of 10/01/14: 86.6 kg (191 lb).   Imaging Review Plain radiographs demonstrate moderate degenerative joint disease of the left knee(s). The overall alignment  ismild varus. The bone quality appears to be good for age and reported activity level.   Preoperative templating of the joint replacement has been completed, documented, and submitted to the Operating Room personnel in order to optimize intra-operative equipment management.   Anticipated LOS equal to or greater than 2 midnights due to - Age 66 and older with one or more of the following:  - Obesity  - Expected need for hospital services (PT, OT, Nursing) required for safe  discharge  - Anticipated need for postoperative skilled nursing care or inpatient rehab  - Active co-morbidities: Diabetes OR   - Unanticipated findings during/Post Surgery: None  - Patient is a high risk of re-admission due to: None     Assessment/Plan:  End stage arthritis, left knee   The patient history, physical examination, clinical judgment  of the provider and imaging studies are consistent with end stage degenerative joint disease of the left knee(s) and total knee arthroplasty is deemed medically necessary. The treatment options including medical management, injection therapy arthroscopy and arthroplasty were discussed at length. The risks and benefits of total knee arthroplasty were presented and reviewed. The risks due to aseptic loosening, infection, stiffness, patella tracking problems, thromboembolic complications and other imponderables were discussed. The patient acknowledged the explanation, agreed to proceed with the plan and consent was signed. Patient is being admitted for inpatient treatment for surgery, pain control, PT, OT, prophylactic antibiotics, VTE prophylaxis, progressive ambulation and ADL's and discharge planning. The patient is planning to be discharged to skilled nursing facility

## 2017-10-19 ENCOUNTER — Encounter (HOSPITAL_COMMUNITY): Payer: Self-pay

## 2017-10-19 NOTE — Pre-Procedure Instructions (Signed)
KAEDEN DEPAZ  10/19/2017      [facility] - [place], [place] - [place], [place] - [address] [place] [address] Phone: [phone] Fax: [phone]    Your procedure is scheduled on July 26th.  Report to [facility] Admitting at 8:15 A.M.  Call this number if you have problems the morning of surgery:  [phone]   Remember:  Do not eat or drink after midnight.   Continue all medications as directed by your physician except follow these medication instructions before surgery below  Follow your doctors instructions regarding your Aspirin.  If no instructions were given by your doctor, then you will need to call the prescribing office office to get instructions.    7 days prior to surgery STOP taking any Aspirin(unless otherwise instructed by your surgeon), VOLTAREN, Aleve, Naproxen, Ibuprofen, Motrin, Advil, Goody's, BC's, all herbal medications, fish oil, and all vitamins   Take these medicines the morning of surgery with A SIP OF WATER: atenolol (TENORMIN) 25 MG tablet mirabegron ER (MYRBETRIQ) 50 MG TB24 tablet omeprazole (PRILOSEC) 20 MG capsule    WHAT DO I DO ABOUT MY DIABETES MEDICATION?   Marland Kitchen Do not take oral diabetes medicines (pills) the morning of surgery. DO NOT TAKE METFORMIN THE MORNING OF SURGERY.    . The day of surgery, do not take other diabetes injectables, including Byetta (exenatide), Bydureon (exenatide ER), Victoza (liraglutide), or Trulicity (dulaglutide).  . If your CBG is greater than 220 mg/dL, you may take  of your sliding scale (correction) dose of insulin.   How to Manage Your Diabetes Before and After Surgery  Why is it important to control my blood sugar before and after surgery? . Improving blood sugar levels before and after surgery helps healing and can limit problems. . A way of improving blood sugar control is eating a healthy diet by: o  Eating less sugar and carbohydrates o  Increasing  activity/exercise o  Talking with your doctor about reaching your blood sugar goals . High blood sugars (greater than 180 mg/dL) can raise your risk of infections and slow your recovery, so you will need to focus on controlling your diabetes during the weeks before surgery. . Make sure that the doctor who takes care of your diabetes knows about your planned surgery including the date and location.  How do I manage my blood sugar before surgery? . Check your blood sugar at least 4 times a day, starting 2 days before surgery, to make sure that the level is not too high or low. o Check your blood sugar the morning of your surgery when you wake up and every 2 hours until you get to the Short Stay unit. . If your blood sugar is less than 70 mg/dL, you will need to treat for low blood sugar: o Do not take insulin. o Treat a low blood sugar (less than 70 mg/dL) with  cup of clear juice (cranberry or apple), 4 glucose tablets, OR glucose gel. o Recheck blood sugar in 15 minutes after treatment (to make sure it is greater than 70 mg/dL). If your blood sugar is not greater than 70 mg/dL on recheck, call [phone] for further instructions. . Report your blood sugar to the short stay nurse when you get to Short Stay.  . If you are admitted to the hospital after surgery: o Your blood sugar will be checked by the staff and you will probably be given insulin after surgery (  instead of oral diabetes medicines) to make sure you have good blood sugar levels. o The goal for blood sugar control after surgery is 80-180 mg/dL.    Do not wear jewelry, make-up or nail polish.  Do not wear lotions, powders, or perfumes, or deodorant.  Do not shave 48 hours prior to surgery.    Do not bring valuables to the hospital.  [facility] is not responsible for any belongings or valuables.  Contacts, dentures or bridgework may not be worn into surgery.  Leave your suitcase in the car.  After surgery it may be brought to  your room.  For patients admitted to the hospital, discharge time will be determined by your treatment team.  Patients discharged the day of surgery will not be allowed to drive home.    Special instructions:   [place]- Preparing For Surgery  Before surgery, you can play an important role. Because skin is not sterile, your skin needs to be as free of germs as possible. You can reduce the number of germs on your skin by washing with CHG (chlorahexidine gluconate) Soap before surgery.  CHG is an antiseptic cleaner which kills germs and bonds with the skin to continue killing germs even after washing.    Oral Hygiene is also important to reduce your risk of infection.  Remember - BRUSH YOUR TEETH THE MORNING OF SURGERY WITH YOUR REGULAR TOOTHPASTE  Please do not use if you have an allergy to CHG or antibacterial soaps. If your skin becomes reddened/irritated stop using the CHG.  Do not shave (including legs and underarms) for at least 48 hours prior to first CHG shower. It is OK to shave your face.  Please follow these instructions carefully.   1. Shower the NIGHT BEFORE SURGERY and the MORNING OF SURGERY with CHG.   2. If you chose to wash your hair, wash your hair first as usual with your normal shampoo.  3. After you shampoo, rinse your hair and body thoroughly to remove the shampoo.  4. Use CHG as you would any other liquid soap. You can apply CHG directly to the skin and wash gently with a scrungie or a clean washcloth.   5. Apply the CHG Soap to your body ONLY FROM THE NECK DOWN.  Do not use on open wounds or open sores. Avoid contact with your eyes, ears, mouth and genitals (private parts). Wash Face and genitals (private parts)  with your normal soap.  6. Wash thoroughly, paying special attention to the area where your surgery will be performed.  7. Thoroughly rinse your body with warm water from the neck down.  8. DO NOT shower/wash with your normal soap after using and  rinsing off the CHG Soap.  9. Pat yourself dry with a CLEAN TOWEL.  10. Wear CLEAN PAJAMAS to bed the night before surgery, wear comfortable clothes the morning of surgery  11. Place CLEAN SHEETS on your bed the night of your first shower and DO NOT SLEEP WITH PETS.    Day of Surgery:  Do not apply any deodorants/lotions.  Please wear clean clothes to the hospital/surgery center.   Remember to brush your teeth WITH YOUR REGULAR TOOTHPASTE.  Please read over the following fact sheets that you were given.

## 2017-10-22 ENCOUNTER — Other Ambulatory Visit: Payer: Self-pay

## 2017-10-22 ENCOUNTER — Ambulatory Visit (HOSPITAL_COMMUNITY)
Admission: RE | Admit: 2017-10-22 | Discharge: 2017-10-22 | Disposition: A | Discharge status: Home / Self Care | Payer: Medicare Other | Source: Ambulatory Visit | Attending: Physician Assistant | Admitting: Physician Assistant

## 2017-10-22 ENCOUNTER — Encounter (HOSPITAL_COMMUNITY)
Admission: RE | Admit: 2017-10-22 | Discharge: 2017-10-22 | Disposition: A | Discharge status: Home / Self Care | Payer: Medicare Other | Source: Ambulatory Visit | Attending: Orthopedic Surgery | Admitting: Orthopedic Surgery

## 2017-10-22 ENCOUNTER — Encounter (HOSPITAL_COMMUNITY): Payer: Self-pay

## 2017-10-22 DIAGNOSIS — Z0183 Encounter for blood typing: Secondary | ICD-10-CM | POA: Diagnosis not present

## 2017-10-22 DIAGNOSIS — M1712 Unilateral primary osteoarthritis, left knee: Secondary | ICD-10-CM | POA: Diagnosis not present

## 2017-10-22 DIAGNOSIS — Z01812 Encounter for preprocedural laboratory examination: Secondary | ICD-10-CM | POA: Diagnosis not present

## 2017-10-22 DIAGNOSIS — Z01818 Encounter for other preprocedural examination: Secondary | ICD-10-CM | POA: Diagnosis present

## 2017-10-22 HISTORY — DX: Unilateral primary osteoarthritis, unspecified knee: M17.10

## 2017-10-22 LAB — URINALYSIS, ROUTINE W REFLEX MICROSCOPIC
Bacteria, UA: NONE SEEN
Bilirubin Urine: NEGATIVE
Glucose, UA: NEGATIVE mg/dL
Hgb urine dipstick: NEGATIVE
KETONES UR: NEGATIVE mg/dL
Nitrite: NEGATIVE
PROTEIN: NEGATIVE mg/dL
Specific Gravity, Urine: 1.018 (ref 1.005–1.030)
pH: 5 (ref 5.0–8.0)

## 2017-10-22 LAB — CBC WITH DIFFERENTIAL/PLATELET
Abs Immature Granulocytes: 0 10*3/uL (ref 0.0–0.1)
BASOS ABS: 0 10*3/uL (ref 0.0–0.1)
Basophils Relative: 1 %
EOS ABS: 0.2 10*3/uL (ref 0.0–0.7)
EOS PCT: 3 %
HCT: 37.8 % (ref 36.0–46.0)
HEMOGLOBIN: 11.7 g/dL — AB (ref 12.0–15.0)
Immature Granulocytes: 0 %
Lymphocytes Relative: 28 %
Lymphs Abs: 1.5 10*3/uL (ref 0.7–4.0)
MCH: 28.6 pg (ref 26.0–34.0)
MCHC: 31 g/dL (ref 30.0–36.0)
MCV: 92.4 fL (ref 78.0–100.0)
MONO ABS: 0.4 10*3/uL (ref 0.1–1.0)
Monocytes Relative: 8 %
Neutro Abs: 3.3 10*3/uL (ref 1.7–7.7)
Neutrophils Relative %: 60 %
Platelets: 248 10*3/uL (ref 150–400)
RBC: 4.09 MIL/uL (ref 3.87–5.11)
RDW: 14.4 % (ref 11.5–15.5)
WBC: 5.3 10*3/uL (ref 4.0–10.5)

## 2017-10-22 LAB — COMPREHENSIVE METABOLIC PANEL
ALBUMIN: 3.9 g/dL (ref 3.5–5.0)
ALK PHOS: 47 U/L (ref 38–126)
ALT: 24 U/L (ref 0–44)
ANION GAP: 10 (ref 5–15)
AST: 18 U/L (ref 15–41)
BILIRUBIN TOTAL: 0.6 mg/dL (ref 0.3–1.2)
BUN: 9 mg/dL (ref 8–23)
CALCIUM: 9.9 mg/dL (ref 8.9–10.3)
CO2: 24 mmol/L (ref 22–32)
Chloride: 106 mmol/L (ref 98–111)
Creatinine, Ser: 0.89 mg/dL (ref 0.44–1.00)
GFR calc non Af Amer: >60 mL/min (ref >60)
GLUCOSE: 190 mg/dL — AB (ref 70–99)
POTASSIUM: 3.9 mmol/L (ref 3.5–5.1)
SODIUM: 140 mmol/L (ref 135–145)
TOTAL PROTEIN: 6.9 g/dL (ref 6.5–8.1)

## 2017-10-22 LAB — PROTIME-INR
INR: 0.93
Prothrombin Time: 12.4 seconds (ref 11.4–15.2)

## 2017-10-22 LAB — GLUCOSE, CAPILLARY: GLUCOSE-CAPILLARY: 172 mg/dL — AB (ref 70–99)

## 2017-10-22 LAB — ABO/RH: ABO/RH(D): B POS

## 2017-10-22 LAB — APTT: APTT: 28 s (ref 24–36)

## 2017-10-22 LAB — TYPE AND SCREEN
ABO/RH(D): B POS
ANTIBODY SCREEN: NEGATIVE

## 2017-10-22 LAB — SURGICAL PCR SCREEN
MRSA, PCR: NEGATIVE
STAPHYLOCOCCUS AUREUS: NEGATIVE

## 2017-10-22 LAB — HEMOGLOBIN A1C
HEMOGLOBIN A1C: 7.7 % — AB (ref 4.8–5.6)
MEAN PLASMA GLUCOSE: 174.29 mg/dL

## 2017-10-22 NOTE — Progress Notes (Signed)
PCP/ Cardio - Dr. Clelia CroftShaw- [facility]  Chest x-ray - 10/22/17  EKG - 10/22/17  Stress Test - > 2346yrs  ECHO - 2000- neg  Cardiac Cath - Denies  Sleep Study - Yes- Positive CPAP - None  LABS- 10/22/17: CBC w/D, CMP, PT, PTT, T/S, UA, UC  ASA- LD- 3 wks ago  HA1C- 10/22/17 Fasting Blood Sugar - 130-140, Today 172 Checks Blood Sugar ___1__ time a day  Anesthesia- Yes- EKG  Pt denies having chest pain, sob, or fever at this time. All instructions explained to the pt, with a verbal understanding of the material. Pt agrees to go over the instructions while at home for a better understanding. The opportunity to ask questions was provided.

## 2017-10-23 LAB — URINE CULTURE

## 2017-10-23 NOTE — Progress Notes (Signed)
Anesthesia Chart Review:  Case:  [id number] Date/Time:  11/02/17 1000   Procedure:  LEFT TOTAL KNEE ARTHROPLASTY (Left Knee)   Anesthesia type:  Choice   Pre-op diagnosis:  OA LEFT KNEE   Location:  [facility] ROOM 07 / [facility] OR   Surgeon:  Frederico Hamman, MD      DISCUSSION: 72 yo female never smoker for above procedure. Pertinent hx includes GERD, OSA (intolerant CPAP), DMII, MVP.  I was asked to review chart for abnormal EKG. Tracing dated 10/22/2017 shows Normal sinus rhythm with T wave abnormality, consider anterior ischemia. When compared to tracing from 08/29/2011 there is minimal interval change, maybe slightly more pronounced t wave inversion. There is also an EKG from 2009 under media tab scanned in Correspondence showing t wave inversion in lateral leads. This appears to be a longstanding stable t wave inversion.  Anticipate can proceed with surgery as planned barring acute status change  VS: BP 128/81   Pulse 91   Temp 36.5 C   Resp 20   Ht 5\' 3"  (1.6 m)   Wt 189 lb 8 oz (86 kg)   SpO2 99%   BMI 33.57 kg/m   PROVIDERS: [name], MD is PCP  LABS: Labs reviewed: Acceptable for surgery. Abnormal urine culture called to surgeon's office for review. (all labs ordered are listed, but only abnormal results are displayed)  Labs Reviewed  URINE CULTURE - Abnormal; Notable for the following components:      Result Value   Culture MULTIPLE SPECIES PRESENT, SUGGEST RECOLLECTION (*)    All other components within normal limits  GLUCOSE, CAPILLARY - Abnormal; Notable for the following components:   Glucose-Capillary 172 (*)    All other components within normal limits  CBC WITH DIFFERENTIAL/PLATELET - Abnormal; Notable for the following components:   Hemoglobin 11.7 (*)    All other components within normal limits  COMPREHENSIVE METABOLIC PANEL - Abnormal; Notable for the following components:   Glucose, Bld 190 (*)    All other components within normal limits  URINALYSIS, ROUTINE  W REFLEX MICROSCOPIC - Abnormal; Notable for the following components:   APPearance HAZY (*)    Leukocytes, UA SMALL (*)    All other components within normal limits  HEMOGLOBIN A1C - Abnormal; Notable for the following components:   Hgb A1c MFr Bld 7.7 (*)    All other components within normal limits  SURGICAL PCR SCREEN  APTT  PROTIME-INR  TYPE AND SCREEN  ABO/RH     IMAGES: CHEST - 2 VIEW 10/22/2017  COMPARISON:  07/01/2006  FINDINGS: The heart size and mediastinal contours are within normal limits. Both lungs are clear. The visualized skeletal structures are unremarkable. Mild scarring in the lung bases.  IMPRESSION: No active cardiopulmonary disease.    EKG: 10/22/2017: Normal sinus rhythm. T wave abnormality, consider anterior ischemia T wave inversion maybe slightly more pronounced compared to tracing frmo 08/29/2011. 08/29/2011: Normal sinus rhythm. Nonspecific t-wave abnormality.   CV: N/A  Past Medical History:  Diagnosis Date  . Diabetes mellitus without complication (HCC)    Type II  . Frequency of urination   . GERD (gastroesophageal reflux disease)   . Heart murmur   . Migraines   . MVP (mitral valve prolapse)   . OSA (obstructive sleep apnea) INTOLERANT CPAP   . Primary localized osteoarthritis of knee    Left  . SOB (shortness of breath) on exertion   . Stress incontinence   . 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 Urgency of urination     Past Surgical History:  Procedure Laterality Date  . ABDOMINAL HYSTERECTOMY    . BLADDER SUSPENSION    . COLONOSCOPY  03/22/2012    Romie LeveeAlicia Thomas, MD-NL BUT INCOMPLETE  . DILATION AND CURETTAGE OF UTERUS    . PUBOVAGINAL SLING  08/25/2011   Procedure: Leonides GrillsPUBO-VAGINAL SLING;  Surgeon: Kathi LudwigSigmund I Tannenbaum, MD;  [facility];  Service: Urology;  Laterality: N/A;  BOSTON SCIENTIFIC MESH UPHOLD LITE ANTERIOR VAULT REPAIR   . TONSILLECTOMY AND ADENOIDECTOMY  AGE 62  . TOTAL  ABDOMINAL HYSTERECTOMY W/ BILATERAL SALPINGOOPHORECTOMY  1990    MEDICATIONS: . aspirin 81 MG tablet  . aspirin-acetaminophen-caffeine (EXCEDRIN MIGRAINE) 250-250-65 MG per tablet  . atenolol (TENORMIN) 25 MG tablet  . atorvastatin (LIPITOR) 20 MG tablet  . benazepril (LOTENSIN) 5 MG tablet  . BL [facility] PO  . Cholecalciferol (VITAMIN D) 2000 units tablet  . Cyanocobalamin (B-12) 2500 MCG TABS  . diclofenac (VOLTAREN) 75 MG EC tablet  . HYDROcodone-acetaminophen (NORCO/VICODIN) 5-325 MG tablet  . l-methylfolate-B6-B12 (METANX) 3-35-2 MG TABS tablet  . Melatonin 5 MG TABS  . metFORMIN (GLUCOPHAGE) 500 MG tablet  . mirabegron ER (MYRBETRIQ) 50 MG TB24 tablet  . omeprazole (PRILOSEC) 20 MG capsule   No current facility-administered medications for this encounter.     Zannie CoveJames Burns, PA-C [facility]iology Phone [phone] 10/23/2017 4:15 PM

## 2017-10-24 ENCOUNTER — Ambulatory Visit: Payer: Medicare Other | Admitting: Cardiology

## 2017-11-01 MED ORDER — CEFAZOLIN SODIUM-DEXTROSE 2-4 GM/100ML-% IV SOLN
2.0000 g | INTRAVENOUS | Status: AC
  Administered 2017-11-02: 2 g via INTRAVENOUS
  Filled 2017-11-01: qty 100

## 2017-11-01 MED ORDER — SODIUM CHLORIDE 0.9 % IV SOLN
1000.0000 mg | INTRAVENOUS | Status: AC
  Administered 2017-11-02: 1000 mg via INTRAVENOUS
  Filled 2017-11-01: qty 1100

## 2017-11-01 MED ORDER — SODIUM CHLORIDE 0.9 % IV SOLN
2000.0000 mg | INTRAVENOUS | Status: AC
  Administered 2017-11-02: 2000 mg via TOPICAL
  Filled 2017-11-01: qty 20

## 2017-11-01 MED ORDER — BUPIVACAINE LIPOSOME 1.3 % IJ SUSP
20.0000 mL | INTRAMUSCULAR | Status: DC
  Filled 2017-11-01: qty 20

## 2017-11-02 ENCOUNTER — Other Ambulatory Visit: Payer: Self-pay

## 2017-11-02 ENCOUNTER — Ambulatory Visit (HOSPITAL_COMMUNITY): Payer: Medicare Other | Admitting: Physician Assistant

## 2017-11-02 ENCOUNTER — Encounter (HOSPITAL_COMMUNITY): Payer: Self-pay

## 2017-11-02 ENCOUNTER — Ambulatory Visit (HOSPITAL_COMMUNITY): Payer: Medicare Other | Admitting: Anesthesiology

## 2017-11-02 ENCOUNTER — Inpatient Hospital Stay (HOSPITAL_COMMUNITY)
Admission: AD | Admit: 2017-11-02 | Discharge: 2017-11-05 | DRG: 470 | Disposition: A | Discharge status: Skilled Nursing Facility | Payer: Medicare Other | Source: Ambulatory Visit | Attending: Orthopedic Surgery | Admitting: Orthopedic Surgery

## 2017-11-02 ENCOUNTER — Encounter (HOSPITAL_COMMUNITY)
Admission: AD | Disposition: A | Discharge status: Skilled Nursing Facility | Payer: Self-pay | Source: Ambulatory Visit | Attending: Orthopedic Surgery

## 2017-11-02 DIAGNOSIS — Z79891 Long term (current) use of opiate analgesic: Secondary | ICD-10-CM | POA: Diagnosis not present

## 2017-11-02 DIAGNOSIS — D62 Acute posthemorrhagic anemia: Secondary | ICD-10-CM | POA: Diagnosis not present

## 2017-11-02 DIAGNOSIS — E119 Type 2 diabetes mellitus without complications: Secondary | ICD-10-CM | POA: Diagnosis present

## 2017-11-02 DIAGNOSIS — Z96659 Presence of unspecified artificial knee joint: Secondary | ICD-10-CM

## 2017-11-02 DIAGNOSIS — I341 Nonrheumatic mitral (valve) prolapse: Secondary | ICD-10-CM | POA: Diagnosis present

## 2017-11-02 DIAGNOSIS — E669 Obesity, unspecified: Secondary | ICD-10-CM | POA: Diagnosis present

## 2017-11-02 DIAGNOSIS — Z79899 Other long term (current) drug therapy: Secondary | ICD-10-CM | POA: Diagnosis not present

## 2017-11-02 DIAGNOSIS — Z6833 Body mass index (BMI) 33.0-33.9, adult: Secondary | ICD-10-CM | POA: Diagnosis not present

## 2017-11-02 DIAGNOSIS — Z882 Allergy status to sulfonamides status: Secondary | ICD-10-CM | POA: Diagnosis not present

## 2017-11-02 DIAGNOSIS — G4733 Obstructive sleep apnea (adult) (pediatric): Secondary | ICD-10-CM | POA: Diagnosis present

## 2017-11-02 DIAGNOSIS — M1712 Unilateral primary osteoarthritis, left knee: Principal | ICD-10-CM | POA: Diagnosis present

## 2017-11-02 DIAGNOSIS — Z7984 Long term (current) use of oral hypoglycemic drugs: Secondary | ICD-10-CM

## 2017-11-02 DIAGNOSIS — K219 Gastro-esophageal reflux disease without esophagitis: Secondary | ICD-10-CM | POA: Diagnosis present

## 2017-11-02 DIAGNOSIS — M1710 Unilateral primary osteoarthritis, unspecified knee: Secondary | ICD-10-CM | POA: Diagnosis present

## 2017-11-02 DIAGNOSIS — I10 Essential (primary) hypertension: Secondary | ICD-10-CM | POA: Diagnosis present

## 2017-11-02 DIAGNOSIS — Z7982 Long term (current) use of aspirin: Secondary | ICD-10-CM | POA: Diagnosis not present

## 2017-11-02 HISTORY — PX: TOTAL KNEE ARTHROPLASTY: SHX125

## 2017-11-02 HISTORY — DX: Anxiety disorder, unspecified: F41.9

## 2017-11-02 HISTORY — DX: Type 2 diabetes mellitus without complications: E11.9

## 2017-11-02 HISTORY — DX: Unspecified osteoarthritis, unspecified site: M19.90

## 2017-11-02 HISTORY — DX: Pure hypercholesterolemia, unspecified: E78.00

## 2017-11-02 LAB — GLUCOSE, CAPILLARY
GLUCOSE-CAPILLARY: 159 mg/dL — AB (ref 70–99)
GLUCOSE-CAPILLARY: 138 mg/dL — AB (ref 70–99)
GLUCOSE-CAPILLARY: 117 mg/dL — AB (ref 70–99)
GLUCOSE-CAPILLARY: 177 mg/dL — AB (ref 70–99)
Glucose-Capillary: 237 mg/dL — ABNORMAL HIGH (ref 70–99)

## 2017-11-02 LAB — URINALYSIS, ROUTINE W REFLEX MICROSCOPIC
Bilirubin Urine: NEGATIVE
Glucose, UA: NEGATIVE mg/dL
HGB URINE DIPSTICK: NEGATIVE
Ketones, ur: NEGATIVE mg/dL
LEUKOCYTES UA: NEGATIVE
Nitrite: NEGATIVE
PH: 5 (ref 5.0–8.0)
Protein, ur: NEGATIVE mg/dL
SPECIFIC GRAVITY, URINE: 1.023 (ref 1.005–1.030)

## 2017-11-02 SURGERY — ARTHROPLASTY, KNEE, TOTAL
Anesthesia: Monitor Anesthesia Care | Site: Knee | Laterality: Left

## 2017-11-02 MED ORDER — PROPOFOL 500 MG/50ML IV EMUL
INTRAVENOUS | Status: DC | PRN
  Administered 2017-11-02: 25 ug/kg/min via INTRAVENOUS

## 2017-11-02 MED ORDER — PHENYLEPHRINE HCL 10 MG/ML IJ SOLN
INTRAMUSCULAR | Status: DC | PRN
  Administered 2017-11-02: 40 ug via INTRAVENOUS
  Administered 2017-11-02: 80 ug via INTRAVENOUS
  Administered 2017-11-02: 120 ug via INTRAVENOUS
  Administered 2017-11-02 (×2): 80 ug via INTRAVENOUS

## 2017-11-02 MED ORDER — BENAZEPRIL HCL 5 MG PO TABS
5.0000 mg | ORAL_TABLET | Freq: Every day | ORAL | Status: DC
  Administered 2017-11-03 – 2017-11-05 (×3): 5 mg via ORAL
  Filled 2017-11-02 (×3): qty 1

## 2017-11-02 MED ORDER — ACETAMINOPHEN 325 MG PO TABS: 650.0000 mg | ORAL_TABLET | Freq: Four times a day (QID) | ORAL | 0 refills | Status: DC | PRN

## 2017-11-02 MED ORDER — OXYCODONE HCL 5 MG PO TABS
5.0000 mg | ORAL_TABLET | ORAL | Status: DC | PRN
  Administered 2017-11-02: 10 mg via ORAL
  Administered 2017-11-02 (×2): 5 mg via ORAL
  Administered 2017-11-03 – 2017-11-05 (×11): 10 mg via ORAL
  Filled 2017-11-02: qty 1
  Filled 2017-11-02 (×8): qty 2
  Filled 2017-11-02: qty 1
  Filled 2017-11-02 (×5): qty 2

## 2017-11-02 MED ORDER — INSULIN ASPART 100 UNIT/ML ~~LOC~~ SOLN
0.0000 [IU] | Freq: Three times a day (TID) | SUBCUTANEOUS | Status: DC
  Administered 2017-11-02: 5 [IU] via SUBCUTANEOUS
  Administered 2017-11-03: 3 [IU] via SUBCUTANEOUS
  Administered 2017-11-03: 5 [IU] via SUBCUTANEOUS
  Administered 2017-11-03 – 2017-11-04 (×2): 3 [IU] via SUBCUTANEOUS
  Administered 2017-11-04: 2 [IU] via SUBCUTANEOUS
  Administered 2017-11-05: 3 [IU] via SUBCUTANEOUS

## 2017-11-02 MED ORDER — FENTANYL CITRATE (PF) 100 MCG/2ML IJ SOLN
50.0000 ug | Freq: Once | INTRAMUSCULAR | Status: AC
  Administered 2017-11-02: 50 ug via INTRAVENOUS

## 2017-11-02 MED ORDER — CHLORHEXIDINE GLUCONATE 4 % EX LIQD: 60.0000 mL | Freq: Once | CUTANEOUS | Status: DC

## 2017-11-02 MED ORDER — BUPIVACAINE HCL (PF) 0.75 % IJ SOLN
INTRAMUSCULAR | Status: DC | PRN
  Administered 2017-11-02: 2 mL via INTRATHECAL

## 2017-11-02 MED ORDER — ATORVASTATIN CALCIUM 20 MG PO TABS
20.0000 mg | ORAL_TABLET | Freq: Every evening | ORAL | Status: DC
  Administered 2017-11-02 – 2017-11-04 (×3): 20 mg via ORAL
  Filled 2017-11-02 (×3): qty 1

## 2017-11-02 MED ORDER — CEFAZOLIN SODIUM-DEXTROSE 2-4 GM/100ML-% IV SOLN
2.0000 g | Freq: Four times a day (QID) | INTRAVENOUS | Status: AC
  Administered 2017-11-02 (×2): 2 g via INTRAVENOUS
  Filled 2017-11-02 (×2): qty 100

## 2017-11-02 MED ORDER — INSULIN ASPART 100 UNIT/ML ~~LOC~~ SOLN: 0.0000 [IU] | Freq: Every day | SUBCUTANEOUS | Status: DC

## 2017-11-02 MED ORDER — ONDANSETRON HCL 4 MG/2ML IJ SOLN
INTRAMUSCULAR | Status: DC | PRN
  Administered 2017-11-02: 4 mg via INTRAVENOUS

## 2017-11-02 MED ORDER — OXYCODONE HCL 5 MG/5ML PO SOLN: 5.0000 mg | Freq: Once | ORAL | Status: DC | PRN

## 2017-11-02 MED ORDER — B-12 2500 MCG PO TABS: 2500.0000 ug | ORAL_TABLET | Freq: Every day | ORAL | Status: DC

## 2017-11-02 MED ORDER — MIDAZOLAM HCL 2 MG/2ML IJ SOLN
INTRAMUSCULAR | Status: AC
  Administered 2017-11-02: 2 mg via INTRAVENOUS
  Filled 2017-11-02: qty 2

## 2017-11-02 MED ORDER — POLYETHYLENE GLYCOL 3350 17 G PO PACK: 17.0000 g | PACK | Freq: Every day | ORAL | Status: DC | PRN

## 2017-11-02 MED ORDER — FENTANYL CITRATE (PF) 100 MCG/2ML IJ SOLN
INTRAMUSCULAR | Status: AC
  Administered 2017-11-02: 50 ug via INTRAVENOUS
  Filled 2017-11-02: qty 2

## 2017-11-02 MED ORDER — BUPIVACAINE-EPINEPHRINE (PF) 0.25% -1:200000 IJ SOLN
INTRAMUSCULAR | Status: DC | PRN
  Administered 2017-11-02: 50 mL

## 2017-11-02 MED ORDER — FENTANYL CITRATE (PF) 250 MCG/5ML IJ SOLN
INTRAMUSCULAR | Status: AC
  Filled 2017-11-02: qty 5

## 2017-11-02 MED ORDER — HYDROMORPHONE HCL 1 MG/ML IJ SOLN: 0.2500 mg | INTRAMUSCULAR | Status: DC | PRN

## 2017-11-02 MED ORDER — ROPIVACAINE HCL 5 MG/ML IJ SOLN
INTRAMUSCULAR | Status: DC | PRN
  Administered 2017-11-02: 20 mL via PERINEURAL

## 2017-11-02 MED ORDER — METOCLOPRAMIDE HCL 5 MG/ML IJ SOLN: 5.0000 mg | Freq: Three times a day (TID) | INTRAMUSCULAR | Status: DC | PRN

## 2017-11-02 MED ORDER — MENTHOL 3 MG MT LOZG: 1.0000 | LOZENGE | OROMUCOSAL | Status: DC | PRN

## 2017-11-02 MED ORDER — SODIUM CHLORIDE 0.9 % IV SOLN
INTRAVENOUS | Status: DC
  Administered 2017-11-02 – 2017-11-03 (×3): via INTRAVENOUS

## 2017-11-02 MED ORDER — PANTOPRAZOLE SODIUM 40 MG PO TBEC
40.0000 mg | DELAYED_RELEASE_TABLET | Freq: Every day | ORAL | Status: DC
  Administered 2017-11-03 – 2017-11-05 (×3): 40 mg via ORAL
  Filled 2017-11-02 (×3): qty 1

## 2017-11-02 MED ORDER — DIPHENHYDRAMINE HCL 12.5 MG/5ML PO ELIX: 12.5000 mg | ORAL_SOLUTION | ORAL | Status: DC | PRN

## 2017-11-02 MED ORDER — BISACODYL 10 MG RE SUPP: 10.0000 mg | Freq: Every day | RECTAL | Status: DC | PRN

## 2017-11-02 MED ORDER — SODIUM CHLORIDE 0.9 % IV SOLN: INTRAVENOUS | Status: DC

## 2017-11-02 MED ORDER — OXYCODONE HCL 5 MG PO TABS: 5.0000 mg | ORAL_TABLET | Freq: Once | ORAL | Status: DC | PRN

## 2017-11-02 MED ORDER — ASPIRIN 81 MG PO TABS: 81.0000 mg | ORAL_TABLET | Freq: Two times a day (BID) | ORAL | 0 refills | Status: AC

## 2017-11-02 MED ORDER — LACTATED RINGERS IV SOLN
INTRAVENOUS | Status: DC
  Administered 2017-11-02 (×2): via INTRAVENOUS

## 2017-11-02 MED ORDER — ONDANSETRON HCL 4 MG PO TABS: 4.0000 mg | ORAL_TABLET | Freq: Four times a day (QID) | ORAL | Status: DC | PRN

## 2017-11-02 MED ORDER — MELATONIN 3 MG PO TABS
6.0000 mg | ORAL_TABLET | Freq: Every evening | ORAL | Status: DC | PRN
  Filled 2017-11-02: qty 2

## 2017-11-02 MED ORDER — ASPIRIN 81 MG PO CHEW
81.0000 mg | CHEWABLE_TABLET | Freq: Two times a day (BID) | ORAL | Status: DC
  Administered 2017-11-02 – 2017-11-05 (×6): 81 mg via ORAL
  Filled 2017-11-02 (×6): qty 1

## 2017-11-02 MED ORDER — FLEET ENEMA 7-19 GM/118ML RE ENEM
1.0000 | ENEMA | Freq: Once | RECTAL | Status: DC | PRN
Start: 2017-11-02 — End: 2017-11-05

## 2017-11-02 MED ORDER — INSULIN ASPART 100 UNIT/ML ~~LOC~~ SOLN
4.0000 [IU] | Freq: Three times a day (TID) | SUBCUTANEOUS | Status: DC
  Administered 2017-11-03 – 2017-11-05 (×6): 4 [IU] via SUBCUTANEOUS

## 2017-11-02 MED ORDER — VITAMIN D 50 MCG (2000 UT) PO TABS: 2000.0000 [IU] | ORAL_TABLET | Freq: Every day | ORAL | Status: DC

## 2017-11-02 MED ORDER — ACETAMINOPHEN 325 MG PO TABS: 325.0000 mg | ORAL_TABLET | Freq: Four times a day (QID) | ORAL | Status: DC | PRN

## 2017-11-02 MED ORDER — MIDAZOLAM HCL 2 MG/2ML IJ SOLN
2.0000 mg | Freq: Once | INTRAMUSCULAR | Status: AC
  Administered 2017-11-02: 2 mg via INTRAVENOUS

## 2017-11-02 MED ORDER — DOCUSATE SODIUM 100 MG PO CAPS
100.0000 mg | ORAL_CAPSULE | Freq: Two times a day (BID) | ORAL | Status: DC
  Administered 2017-11-02 – 2017-11-05 (×6): 100 mg via ORAL
  Filled 2017-11-02 (×6): qty 1

## 2017-11-02 MED ORDER — ACETAMINOPHEN 500 MG PO TABS
1000.0000 mg | ORAL_TABLET | Freq: Four times a day (QID) | ORAL | Status: AC
  Administered 2017-11-02 – 2017-11-03 (×3): 1000 mg via ORAL
  Filled 2017-11-02 (×3): qty 2

## 2017-11-02 MED ORDER — SODIUM CHLORIDE 0.9 % IR SOLN
Status: DC | PRN
  Administered 2017-11-02: 3000 mL

## 2017-11-02 MED ORDER — BUPIVACAINE LIPOSOME 1.3 % IJ SUSP
INTRAMUSCULAR | Status: DC | PRN
  Administered 2017-11-02: 20 mL

## 2017-11-02 MED ORDER — SODIUM CHLORIDE 0.9 % IV SOLN
INTRAVENOUS | Status: DC | PRN
  Administered 2017-11-02: 25 ug/min via INTRAVENOUS

## 2017-11-02 MED ORDER — ATENOLOL 50 MG PO TABS
25.0000 mg | ORAL_TABLET | Freq: Every day | ORAL | Status: DC
  Administered 2017-11-03 – 2017-11-04 (×2): 25 mg via ORAL
  Filled 2017-11-02 (×2): qty 1

## 2017-11-02 MED ORDER — ONDANSETRON HCL 4 MG/2ML IJ SOLN
4.0000 mg | Freq: Four times a day (QID) | INTRAMUSCULAR | Status: DC | PRN
  Administered 2017-11-02: 4 mg via INTRAVENOUS
  Filled 2017-11-02 (×2): qty 2

## 2017-11-02 MED ORDER — PROMETHAZINE HCL 25 MG/ML IJ SOLN: 6.2500 mg | INTRAMUSCULAR | Status: DC | PRN

## 2017-11-02 MED ORDER — PHENOL 1.4 % MT LIQD: 1.0000 | OROMUCOSAL | Status: DC | PRN

## 2017-11-02 MED ORDER — SODIUM CHLORIDE 0.9% FLUSH
INTRAVENOUS | Status: DC | PRN
  Administered 2017-11-02: 50 mL

## 2017-11-02 MED ORDER — BUPIVACAINE-EPINEPHRINE 0.25% -1:200000 IJ SOLN
INTRAMUSCULAR | Status: AC
  Filled 2017-11-02: qty 1

## 2017-11-02 MED ORDER — MIRABEGRON ER 25 MG PO TB24
50.0000 mg | ORAL_TABLET | Freq: Every day | ORAL | Status: DC
  Administered 2017-11-03 – 2017-11-05 (×3): 50 mg via ORAL
  Filled 2017-11-02 (×3): qty 2

## 2017-11-02 MED ORDER — FENTANYL CITRATE (PF) 100 MCG/2ML IJ SOLN
INTRAMUSCULAR | Status: DC | PRN
  Administered 2017-11-02 (×2): 50 ug via INTRAVENOUS

## 2017-11-02 MED ORDER — HYDROMORPHONE HCL 1 MG/ML IJ SOLN
0.5000 mg | INTRAMUSCULAR | Status: DC | PRN
  Administered 2017-11-02: 0.5 mg via INTRAVENOUS
  Filled 2017-11-02: qty 1

## 2017-11-02 MED ORDER — OXYCODONE HCL 5 MG PO TABS: ORAL_TABLET | ORAL | 0 refills | Status: DC

## 2017-11-02 MED ORDER — TRANEXAMIC ACID 1000 MG/10ML IV SOLN
1000.0000 mg | Freq: Once | INTRAVENOUS | Status: AC
  Administered 2017-11-02: 1000 mg via INTRAVENOUS
  Filled 2017-11-02: qty 10

## 2017-11-02 MED ORDER — METOCLOPRAMIDE HCL 5 MG PO TABS: 5.0000 mg | ORAL_TABLET | Freq: Three times a day (TID) | ORAL | Status: DC | PRN

## 2017-11-02 SURGICAL SUPPLY — 59 items
BANDAGE ACE 4X5 VEL STRL LF (GAUZE/BANDAGES/DRESSINGS) ×3 IMPLANT
BANDAGE ACE 6X5 VEL STRL LF (GAUZE/BANDAGES/DRESSINGS) ×3 IMPLANT
BANDAGE ESMARK 6X9 LF (GAUZE/BANDAGES/DRESSINGS) ×1 IMPLANT
BLADE SAGITTAL 25.0X1.19X90 (BLADE) ×2 IMPLANT
BLADE SAGITTAL 25.0X1.19X90MM (BLADE) ×1
BLADE SAW SAG 90X13X1.27 (BLADE) ×3 IMPLANT
BNDG CMPR 9X6 STRL LF SNTH (GAUZE/BANDAGES/DRESSINGS) ×1
BNDG ESMARK 6X9 LF (GAUZE/BANDAGES/DRESSINGS) ×3
BONE CEMENT GENTAMICIN (Cement) ×6 IMPLANT
BOWL SMART MIX CTS (DISPOSABLE) ×3 IMPLANT
CAP KNEE TOTAL 3 SIGMA ×3 IMPLANT
CEMENT BONE GENTAMICIN 40 (Cement) ×2 IMPLANT
COVER SURGICAL LIGHT HANDLE (MISCELLANEOUS) ×6 IMPLANT
CUFF TOURNIQUET SINGLE 34IN LL (TOURNIQUET CUFF) ×3 IMPLANT
DRAPE ORTHO SPLIT 77X108 STRL (DRAPES) ×6
DRAPE SURG ORHT 6 SPLT 77X108 (DRAPES) ×2 IMPLANT
DRAPE U-SHAPE 47X51 STRL (DRAPES) ×3 IMPLANT
DRSG ADAPTIC 3X8 NADH LF (GAUZE/BANDAGES/DRESSINGS) ×3 IMPLANT
DRSG PAD ABDOMINAL 8X10 ST (GAUZE/BANDAGES/DRESSINGS) ×3 IMPLANT
DURAPREP 26ML APPLICATOR (WOUND CARE) ×6 IMPLANT
ELECT REM PT RETURN 9FT ADLT (ELECTROSURGICAL) ×3
ELECTRODE REM PT RTRN 9FT ADLT (ELECTROSURGICAL) ×1 IMPLANT
FACESHIELD WRAPAROUND (MASK) ×3 IMPLANT
GAUZE SPONGE 4X4 12PLY STRL (GAUZE/BANDAGES/DRESSINGS) ×3 IMPLANT
GLOVE BIOGEL PI IND STRL 8 (GLOVE) ×4 IMPLANT
GLOVE BIOGEL PI INDICATOR 8 (GLOVE) ×8
GLOVE ORTHO TXT STRL SZ7.5 (GLOVE) ×3 IMPLANT
GLOVE SURG ORTHO 8.0 STRL STRW (GLOVE) ×3 IMPLANT
GOWN STRL REUS W/ TWL LRG LVL3 (GOWN DISPOSABLE) ×1 IMPLANT
GOWN STRL REUS W/ TWL XL LVL3 (GOWN DISPOSABLE) ×1 IMPLANT
GOWN STRL REUS W/TWL 2XL LVL3 (GOWN DISPOSABLE) ×3 IMPLANT
GOWN STRL REUS W/TWL LRG LVL3 (GOWN DISPOSABLE) ×3
GOWN STRL REUS W/TWL XL LVL3 (GOWN DISPOSABLE) ×3
HANDPIECE INTERPULSE COAX TIP (DISPOSABLE) ×3
HOOD PEEL AWAY FACE SHEILD DIS (HOOD) ×3 IMPLANT
IMMOBILIZER KNEE 22 UNIV (SOFTGOODS) ×3 IMPLANT
KIT BASIN OR (CUSTOM PROCEDURE TRAY) ×3 IMPLANT
KIT TURNOVER KIT B (KITS) ×3 IMPLANT
MANIFOLD NEPTUNE II (INSTRUMENTS) ×3 IMPLANT
NEEDLE 22X1 1/2 (OR ONLY) (NEEDLE) ×6 IMPLANT
NS IRRIG 1000ML POUR BTL (IV SOLUTION) ×3 IMPLANT
PACK TOTAL JOINT (CUSTOM PROCEDURE TRAY) ×3 IMPLANT
PAD ARMBOARD 7.5X6 YLW CONV (MISCELLANEOUS) ×6 IMPLANT
PAD CAST 4YDX4 CTTN HI CHSV (CAST SUPPLIES) ×1 IMPLANT
PADDING CAST COTTON 4X4 STRL (CAST SUPPLIES) ×3
PADDING CAST COTTON 6X4 STRL (CAST SUPPLIES) ×3 IMPLANT
SET HNDPC FAN SPRY TIP SCT (DISPOSABLE) ×1 IMPLANT
STAPLER VISISTAT 35W (STAPLE) ×3 IMPLANT
SUCTION FRAZIER HANDLE 10FR (MISCELLANEOUS) ×2
SUCTION TUBE FRAZIER 10FR DISP (MISCELLANEOUS) ×1 IMPLANT
SUT ETHIBOND NAB CT1 #1 30IN (SUTURE) ×6 IMPLANT
SUT VIC AB 0 CT1 27 (SUTURE) ×3
SUT VIC AB 0 CT1 27XBRD ANBCTR (SUTURE) ×1 IMPLANT
SUT VIC AB 2-0 CT1 27 (SUTURE) ×6
SUT VIC AB 2-0 CT1 TAPERPNT 27 (SUTURE) ×2 IMPLANT
SYR CONTROL 10ML LL (SYRINGE) ×6 IMPLANT
TOWEL GREEN STERILE (TOWEL DISPOSABLE) ×3 IMPLANT
TRAY FOLEY MTR SLVR 16FR STAT (SET/KITS/TRAYS/PACK) ×3 IMPLANT
WATER STERILE IRR 1000ML POUR (IV SOLUTION) ×3 IMPLANT

## 2017-11-02 NOTE — Transfer of Care (Signed)
Immediate Anesthesia Transfer of Care Note  Patient: Kristin Farley  Procedure(s) Performed: LEFT TOTAL KNEE ARTHROPLASTY (Left Knee)  Patient Location: PACU  Anesthesia Type:MAC, Regional and Spinal  Level of Consciousness: awake, alert , oriented and sedated  Airway & Oxygen Therapy: Patient Spontanous Breathing and Patient connected to nasal cannula oxygen  Post-op Assessment: Report given to RN, Post -op Vital signs reviewed and stable and Patient moving all extremities  Post vital signs: Reviewed and stable  Last Vitals:  Vitals Value Taken Time  BP 101/64 11/02/2017  1:00 PM  Temp    Pulse 69 11/02/2017  1:03 PM  Resp 11 11/02/2017  1:03 PM  SpO2 100 % 11/02/2017  1:03 PM  Vitals shown include unvalidated device data.  Last Pain:  Vitals:   11/02/17 0915  TempSrc:   PainSc: 0-No pain      Patients Stated Pain Goal: 3 (56/81/27 5170)  Complications: No apparent anesthesia complications

## 2017-11-02 NOTE — Interval H&P Note (Signed)
History and Physical Interval Note:  11/02/2017 9:37 AM  Kristin Farley  has presented today for surgery, with the diagnosis of OA LEFT KNEE  The various methods of treatment have been discussed with the patient and family. After consideration of risks, benefits and other options for treatment, the patient has consented to  Procedure(s): LEFT TOTAL KNEE ARTHROPLASTY (Left) as a surgical intervention .  The patient's history has been reviewed, patient examined, no change in status, stable for surgery.  I have reviewed the patient's chart and labs.  Questions were answered to the patient's satisfaction.     Thera FlakeW D Christopher Glasscock Jr

## 2017-11-02 NOTE — Brief Op Note (Signed)
11/02/2017  12:36 PM  PATIENT:  Kristin Farley  72 y.o. female  PRE-OPERATIVE DIAGNOSIS:  PRIMARY LOCALIZED OSTEOARTHRITIS LEFT KNEE  POST-OPERATIVE DIAGNOSIS:  PRIMARY LOCALIZED OSTEOARTHRITIS LEFT KNEE  PROCEDURE:  Procedure(s): LEFT TOTAL KNEE ARTHROPLASTY (Left)  SURGEON:  Surgeon(s) and Role:    * Frederico Hammanaffrey, Daniel, MD - Primary  PHYSICIAN ASSISTANT: Margart SicklesJoshua Evvie Behrmann, PA-C ASSISTANTS:    ANESTHESIA:   local, regional, spinal and IV sedation  EBL:  minimal   BLOOD ADMINISTERED:none  DRAINS: none   LOCAL MEDICATIONS USED:  MARCAINE     SPECIMEN:  No Specimen  DISPOSITION OF SPECIMEN:  N/A  COUNTS:  YES  TOURNIQUET:  * Missing tourniquet times found for documented tourniquets in log: [id number] *  DICTATION: .Other Dictation: Dictation Number unknown  PLAN OF CARE: Admit to inpatient   PATIENT DISPOSITION:  PACU - hemodynamically stable.   Delay start of Pharmacological VTE agent (>24hrs) due to surgical blood loss or risk of bleeding: yes

## 2017-11-02 NOTE — Evaluation (Signed)
Physical Therapy Evaluation Patient Details Name: Kristin Farley MRN: [id number] DOB: 07-17-1945 Today's Date: 11/02/2017   History of Present Illness  72 y.o. female s/p L TKA 11/02/17.   Clinical Impression  Patient is s/p above surgery resulting in functional limitations due to the deficits listed below (see PT Problem List). PTA, pt living with grandson, ambulating with SPC. Today pt presents with post op pain and weakness. Pt ambulated 50' with min guard. Initiated therex and reviewed precautions.  Patient will benefit from skilled PT to increase their independence and safety with mobility to allow discharge to the venue listed below.       Follow Up Recommendations Follow surgeon's recommendation for DC plan and follow-up therapies    Equipment Recommendations       Recommendations for Other Services       Precautions / Restrictions Precautions Precautions: Knee Precaution Booklet Issued: Yes (comment) Precaution Comments: reveiwed supine therex and no knee under pillow Restrictions Weight Bearing Restrictions: No      Mobility  Bed Mobility Overal bed mobility: Needs Assistance Bed Mobility: Supine to Sit     Supine to sit: Supervision     General bed mobility comments: increased time and effort  Transfers Overall transfer level: Needs assistance Equipment used: Rolling walker (2 wheeled) Transfers: Sit to/from UGI CorporationStand;Stand Pivot Transfers Sit to Stand: Min assist Stand pivot transfers: Min guard       General transfer comment: cues for hand placement, min A to power up from neutral height  Ambulation/Gait Ambulation/Gait assistance: Min guard Gait Distance (Feet): 50 Feet Assistive device: Rolling walker (2 wheeled) Gait Pattern/deviations: Step-to pattern;Step-through pattern Gait velocity: decreased   General Gait Details: cues for seqeucning, pt min guard for safety. step to gait pattern  Stairs            Wheelchair Mobility     Modified Rankin (Stroke Patients Only)       Balance Overall balance assessment: Needs assistance   Sitting balance-Leahy Scale: Good       Standing balance-Leahy Scale: Poor                               Pertinent Vitals/Pain Pain Assessment: Faces Faces Pain Scale: Hurts little more Pain Location: L knee Pain Descriptors / Indicators: Aching;Operative site guarding Pain Intervention(s): Limited activity within patient's tolerance;Monitored during session;Premedicated before session    Home Living Family/patient expects to be discharged to:: Skilled nursing facility Living Arrangements: Children                    Prior Function Level of Independence: Independent with assistive device(s)               Hand Dominance   Dominant Hand: Right    Extremity/Trunk Assessment   Upper Extremity Assessment Upper Extremity Assessment: Overall WFL for tasks assessed    Lower Extremity Assessment Lower Extremity Assessment: Overall WFL for tasks assessed(L TKA )       Communication   Communication: No difficulties  Cognition Arousal/Alertness: Awake/alert Behavior During Therapy: WFL for tasks assessed/performed Overall Cognitive Status: Within Functional Limits for tasks assessed                                        General Comments      Exercises Total Joint Exercises Ankle Circles/Pumps:  20 reps Quad Sets: 10 reps Heel Slides: 10 reps Long Arc Quad: 10 reps   Assessment/Plan    PT Assessment Patient needs continued PT services  PT Problem List Decreased strength;Decreased range of motion;Decreased activity tolerance;Decreased balance;Decreased mobility       PT Treatment Interventions DME instruction;Gait training;Stair training;Functional mobility training;Therapeutic activities;Therapeutic exercise;Balance training    PT Goals (Current goals can be found in the Care Plan section)  Acute Rehab PT  Goals Patient Stated Goal: go to SNF PT Goal Formulation: With patient Time For Goal Achievement: 11/09/17 Potential to Achieve Goals: Good    Frequency 7X/week   Barriers to discharge        Co-evaluation               AM-PAC PT "6 Clicks" Daily Activity  Outcome Measure Difficulty turning over in bed (including adjusting bedclothes, sheets and blankets)?: A Little Difficulty moving from lying on back to sitting on the side of the bed? : A Little Difficulty sitting down on and standing up from a chair with arms (e.g., wheelchair, bedside commode, etc,.)?: A Little Help needed moving to and from a bed to chair (including a wheelchair)?: A Little Help needed walking in hospital room?: A Little Help needed climbing 3-5 steps with a railing? : A Lot 6 Click Score: 17    End of Session Equipment Utilized During Treatment: Gait belt Activity Tolerance: Patient tolerated treatment well Patient left: in bed;with call bell/phone within reach;with family/visitor present Nurse Communication: Mobility status PT Visit Diagnosis: Unsteadiness on feet (R26.81);Pain Pain - Right/Left: Left Pain - part of body: Knee    Time: 1610-9604 PT Time Calculation (min) (ACUTE ONLY): 35 min   Charges:   PT Evaluation $PT Eval Low Complexity: 1 Low PT Treatments $Gait Training: 8-22 mins        Etta Grandchild, PT, DPT Acute Rehab Services Pager: [phone]   Etta Grandchild 11/02/2017, 4:40 PM

## 2017-11-02 NOTE — Anesthesia Procedure Notes (Signed)
Anesthesia Regional Block: Adductor canal block   Pre-Anesthetic Checklist: ,, timeout performed, Correct Patient, Correct Site, Correct Laterality, Correct Procedure, Correct Position, site marked, Risks and benefits discussed,  Surgical consent,  Pre-op evaluation,  At surgeon's request and post-op pain management  Laterality: Left  Prep: chloraprep       Needles:  Injection technique: Single-shot  Needle Type: Stimiplex     Needle Length: 9cm  Needle Gauge: 21     Additional Needles:   Procedures:,,,, ultrasound used (permanent image in chart),,,,  Narrative:  Start time: 11/02/2017 9:53 AM End time: 11/02/2017 9:58 AM Injection made incrementally with aspirations every 5 mL.  Performed by: Personally  Anesthesiologist: Lowella CurbMiller, Aylinn Rydberg Ray, MD

## 2017-11-02 NOTE — Progress Notes (Signed)
Orthopedic Tech Progress Note Patient Details:  Kristin Farley 1945/07/08 [id number]  [name] [name]: On Left Knee Flexion (Degrees): 90 Left Knee Extension (Degrees): 0 Additional Comments: trapeze bar patient helper Viewed order from doctor's order list Post Interventions Patient Tolerated: Well Instructions Provided: Care of device  Nikki DomCrawford, Avalee Castrellon 11/02/2017, 1:26 PM

## 2017-11-02 NOTE — Discharge Instructions (Signed)

## 2017-11-02 NOTE — Anesthesia Postprocedure Evaluation (Signed)
Anesthesia Post Note  Patient: Kristin Farley  Procedure(s) Performed: LEFT TOTAL KNEE ARTHROPLASTY (Left Knee)     Patient location during evaluation: PACU Anesthesia Type: Spinal Level of consciousness: oriented and awake and alert Pain management: pain level controlled Vital Signs Assessment: post-procedure vital signs reviewed and stable Respiratory status: spontaneous breathing and respiratory function stable Cardiovascular status: blood pressure returned to baseline and stable Postop Assessment: no headache, no backache and no apparent nausea or vomiting Anesthetic complications: no    Last Vitals:  Vitals:   11/02/17 1351 11/02/17 1416  BP: 115/79 118/76  Pulse: 71 74  Resp: 11 14  Temp: 36.4 C 36.4 C  SpO2: 100% 100%    Last Pain:  Vitals:   11/02/17 1416  TempSrc: Oral  PainSc:                  [name]

## 2017-11-02 NOTE — Progress Notes (Signed)
1415 Received pt from PACU, A&O x4. Left leg with ace wrap dry and intact. CPM was on upon arrival. C/o left knee pain, medicated for pain.

## 2017-11-02 NOTE — Op Note (Signed)
NAME: Kristin Farley, Collier Y. MEDICAL RECORD [id number] ACCOUNT [id number] DATE OF BIRTH:[birth year] FACILITY: [facility] LOCATION: [facility] PHYSICIAN:W. Derrik Mceachern JR., MD  OPERATIVE REPORT  DATE OF PROCEDURE:  11/02/2017  PREOPERATIVE DIAGNOSIS:  Severe osteoarthritis, left knee.  POSTOPERATIVE DIAGNOSIS:  Severe osteoarthritis, left knee.  PROCEDURE PERFORMED:  Left total knee replacement, Sigma size 3 femur, tibia with 12.5 mm bearing and 35 mm all poly patella.  SURGEON:  Sharlot GowdaW. Shanikwa State, Jr., MD  ASSISTANT:  Margart SicklesJoshua Chadwell, PA-C.  ANESTHESIA:  Spinal anesthetic with local supplementation.  DESCRIPTION OF PROCEDURE:  A tourniquet exsanguination of the leg and inflation of the tourniquet at 350 mmHg.  Straight skin incision with a medial parapatellar approach to the knee, made.  We cut an 11 mm, 5 degree valgus cut on the femur followed by  about 2-3 mm below the most diseased medial compartment.  Extension gap measured at 10 and eventually 12.5 cm.  Sized the femur to be a size 3.  Placed all in 4-in-1 cutting block with the appropriate degree of external rotation to accomplish an anterior  and posterior chamfer cuts.  Flexion gap equal to extension gap at 12.5 mm. PCL was released.  Small remnants of the menisci and posterior osteophytes were removed.  Tibia was sized to be a size 3 followed by placement of the keel cut for the tibia  followed by the trial tibia.  Box cut was next made for the femur with the trial femur.  Patella was cut leaving about 14 mm of native patella, 35 mm all poly trial.  Final components were inserted and elected to use antibiotic cement.  The patient's  history of diabetes, was noted with premixed antibiotic cement 1 gram gentamicin per batch.  Cement was placed in the doughy stage, tibia followed by femur.  We allowed the cement to harden with the trial bearing in place.  Trial bearing was removed.   Small bits of cement were removed from the posterior  aspect of the knee.  We released the tourniquet.  There was no excessive bleeding noted.  Small bleeders were coagulated.  Final bearing was placed.  Closure was affected with #1 Ethibond, 2-0 Vicryl  and skin clips.    A compressive sterile dressing applied, taken to recovery room in stable condition.  AN/NUANCE  D:11/02/2017 T:11/02/2017 JOB:[id number]

## 2017-11-02 NOTE — Anesthesia Procedure Notes (Signed)
Spinal  Patient location during procedure: OR Start time: 11/02/2017 10:42 AM End time: 11/02/2017 10:47 AM Staffing Anesthesiologist: Lowella CurbMiller, Chaelyn Bunyan Ray, MD Performed: anesthesiologist  Preanesthetic Checklist Completed: patient identified, site marked, surgical consent, pre-op evaluation, timeout performed, IV checked, risks and benefits discussed and monitors and equipment checked Spinal Block Patient position: sitting Prep: Betadine Patient monitoring: heart rate, cardiac monitor, continuous pulse ox and blood pressure Location: L3-4 Injection technique: single-shot Needle Needle type: Pencan  Needle gauge: 24 G Needle length: 9 cm

## 2017-11-02 NOTE — Anesthesia Preprocedure Evaluation (Signed)
Anesthesia Evaluation  Patient identified by MRN, date of birth, ID band Patient awake    Reviewed: Allergy & Precautions, H&P , NPO status , Patient's Chart, lab work & pertinent test results  Airway Mallampati: II  TM Distance: <3 FB Neck ROM: Full    Dental no notable dental hx.    Pulmonary sleep apnea ,    Pulmonary exam normal breath sounds clear to auscultation       Cardiovascular hypertension, Pt. on medications Normal cardiovascular exam Rhythm:Regular Rate:Normal     Neuro/Psych negative neurological ROS  negative psych ROS   GI/Hepatic Neg liver ROS, GERD  Medicated,  Endo/Other  diabetes, Type 2Morbid obesity  Renal/GU negative Renal ROS  negative genitourinary   Musculoskeletal negative musculoskeletal ROS (+)   Abdominal   Peds negative pediatric ROS (+)  Hematology negative hematology ROS (+)   Anesthesia Other Findings   Reproductive/Obstetrics negative OB ROS                             Anesthesia Physical  Anesthesia Plan  ASA: III  Anesthesia Plan: MAC, Regional and Spinal   Post-op Pain Management:  Regional for Post-op pain   Induction: Intravenous  PONV Risk Score and Plan: 2 and Ondansetron and Midazolam  Airway Management Planned: Simple Face Mask  Additional Equipment:   Intra-op Plan:   Post-operative Plan:   Informed Consent: I have reviewed the patients History and Physical, chart, labs and discussed the procedure including the risks, benefits and alternatives for the proposed anesthesia with the patient or authorized representative who has indicated his/her understanding and acceptance.   Dental advisory given  Plan Discussed with: CRNA  Anesthesia Plan Comments:         Anesthesia Quick Evaluation

## 2017-11-02 NOTE — NC FL2 (Signed)
[place] MEDICAID FL2 LEVEL OF CARE SCREENING TOOL     IDENTIFICATION  Patient Name: Kristin Farley Birthdate: 03/05/1946 Sex: female Admission Date (Current Location): 11/02/2017  [facility] and [place] Number:  Producer, television/film/video and Address:  [facility], [address] [place], [place] [address]      Provider Number: [id number]  Attending Physician Name and Address:  Frederico Hamman, MD  Relative Name and Phone Number:       Current Level of Care: Hospital Recommended Level of Care: Skilled Nursing Facility Prior Approval Number:    Date Approved/Denied:   PASRR Number: [id number]  Discharge Plan: SNF    Current Diagnoses: Patient Active Problem List   Diagnosis Date Noted  . S/P knee replacement 11/02/2017  . Constipation by delayed colonic transit 10/01/2014    Orientation RESPIRATION BLADDER Height & Weight     Self, Time, Situation, Place  Normal Continent Weight: 189 lb (85.7 kg) Height:  5\' 3"  (160 cm)  BEHAVIORAL SYMPTOMS/MOOD NEUROLOGICAL BOWEL NUTRITION STATUS      Continent Diet(Carb modified, thin liquids)  AMBULATORY STATUS COMMUNICATION OF NEEDS Skin   Limited Assist Verbally Surgical wounds(Closed incision, left knee, compression wrapped)                       Personal Care Assistance Level of Assistance  Bathing, Feeding, Dressing Bathing Assistance: Limited assistance Feeding assistance: Independent Dressing Assistance: Limited assistance     Functional Limitations Info  Sight, Hearing, Speech Sight Info: Adequate Hearing Info: Adequate Speech Info: Adequate    SPECIAL CARE FACTORS FREQUENCY  PT (By licensed PT), OT (By licensed OT)     PT Frequency: 7x OT Frequency: 7x            Contractures Contractures Info: Not present    Additional Factors Info  Code Status, Allergies Code Status Info: Full Code Allergies Info: Sulfur           Current Medications (11/02/2017):  This is the  current hospital active medication list Current Facility-Administered Medications  Medication Dose Route Frequency Provider Last Rate Last Dose  . 0.9 %  sodium chloride infusion   Intravenous Continuous Chadwell, Ivin Booty, PA-C 75 mL/hr at 11/02/17 1536    . acetaminophen (TYLENOL) tablet 1,000 mg  1,000 mg Oral Q6H Chadwell, Joshua, PA-C   1,000 mg at 11/02/17 1443  . [START ON 11/03/2017] acetaminophen (TYLENOL) tablet 325-650 mg  325-650 mg Oral Q6H PRN Chadwell, Joshua, PA-C      . aspirin chewable tablet 81 mg  81 mg Oral BID Chadwell, Joshua, PA-C      . [START ON 11/03/2017] atenolol (TENORMIN) tablet 25 mg  25 mg Oral QHS Chadwell, Joshua, PA-C      . atorvastatin (LIPITOR) tablet 20 mg  20 mg Oral QPM Chadwell, Joshua, PA-C   20 mg at 11/02/17 1727  . [START ON 11/03/2017] benazepril (LOTENSIN) tablet 5 mg  5 mg Oral Daily Chadwell, Joshua, PA-C      . bisacodyl (DULCOLAX) suppository 10 mg  10 mg Rectal Daily PRN Chadwell, Joshua, PA-C      . ceFAZolin (ANCEF) IVPB 2g/100 mL premix  2 g Intravenous Q6H Chadwell, Joshua, PA-C 200 mL/hr at 11/02/17 1728 2 g at 11/02/17 1728  . diphenhydrAMINE (BENADRYL) 12.5 MG/5ML elixir 12.5-25 mg  12.5-25 mg Oral Q4H PRN Chadwell, Joshua, PA-C      . docusate sodium (COLACE) capsule 100 mg  100 mg Oral  BID Chadwell, Ivin BootyJoshua, [place] JerseyPA-C      . HYDROmorphone (DILAUDID) injection 0.5-1 mg  0.5-1 mg Intravenous Q4H PRN Chadwell, Joshua, PA-C   0.5 mg at 11/02/17 1643  . insulin aspart (novoLOG) injection 0-15 Units  0-15 Units Subcutaneous TID WC Chadwell, Joshua, PA-C   5 Units at 11/02/17 1726  . insulin aspart (novoLOG) injection 0-5 Units  0-5 Units Subcutaneous QHS Chadwell, Joshua, PA-C      . insulin aspart (novoLOG) injection 4 Units  4 Units Subcutaneous TID WC Chadwell, Joshua, PA-C      . lactated ringers infusion   Intravenous Continuous Lowella CurbMiller, Warren Ray, MD 10 mL/hr at 11/02/17 [phone]    . Melatonin TABS 6 mg  6 mg Oral QHS PRN Chadwell, Joshua, PA-C       . menthol-cetylpyridinium (CEPACOL) lozenge 3 mg  1 lozenge Oral PRN Chadwell, Joshua, PA-C       Or  . phenol (CHLORASEPTIC) mouth spray 1 spray  1 spray Mouth/Throat PRN Chadwell, Joshua, PA-C      . metoCLOPramide (REGLAN) tablet 5-10 mg  5-10 mg Oral Q8H PRN Chadwell, Joshua, PA-C       Or  . metoCLOPramide (REGLAN) injection 5-10 mg  5-10 mg Intravenous Q8H PRN Chadwell, Joshua, PA-C      . [START ON 11/03/2017] mirabegron ER (MYRBETRIQ) tablet 50 mg  50 mg Oral Daily Chadwell, Joshua, PA-C      . ondansetron (ZOFRAN) tablet 4 mg  4 mg Oral Q6H PRN Chadwell, Joshua, PA-C       Or  . ondansetron (ZOFRAN) injection 4 mg  4 mg Intravenous Q6H PRN Chadwell, Joshua, PA-C   4 mg at 11/02/17 1731  . oxyCODONE (Oxy IR/ROXICODONE) immediate release tablet 5-10 mg  5-10 mg Oral Q4H PRN Chadwell, Joshua, PA-C   5 mg at 11/02/17 1534  . [START ON 11/03/2017] pantoprazole (PROTONIX) EC tablet 40 mg  40 mg Oral Daily Chadwell, Joshua, PA-C      . polyethylene glycol (MIRALAX / GLYCOLAX) packet 17 g  17 g Oral Daily PRN Chadwell, Joshua, PA-C      . sodium phosphate (FLEET) 7-19 GM/118ML enema 1 enema  1 enema Rectal Once PRN Margart Sickleshadwell, Joshua, PA-C         Discharge Medications: Please see discharge summary for a list of discharge medications.  Relevant Imaging Results:  Relevant Lab Results:   Additional Information SSN: [id number]  Maree KrabbeBridget A Lavella Myren, LCSW

## 2017-11-03 ENCOUNTER — Encounter (HOSPITAL_COMMUNITY): Payer: Self-pay | Admitting: Orthopedic Surgery

## 2017-11-03 DIAGNOSIS — K219 Gastro-esophageal reflux disease without esophagitis: Secondary | ICD-10-CM | POA: Diagnosis present

## 2017-11-03 DIAGNOSIS — E119 Type 2 diabetes mellitus without complications: Secondary | ICD-10-CM

## 2017-11-03 DIAGNOSIS — M1710 Unilateral primary osteoarthritis, unspecified knee: Secondary | ICD-10-CM | POA: Diagnosis present

## 2017-11-03 DIAGNOSIS — I341 Nonrheumatic mitral (valve) prolapse: Secondary | ICD-10-CM | POA: Diagnosis present

## 2017-11-03 DIAGNOSIS — G4733 Obstructive sleep apnea (adult) (pediatric): Secondary | ICD-10-CM | POA: Diagnosis present

## 2017-11-03 LAB — CBC
HEMATOCRIT: 33.6 % — AB (ref 36.0–46.0)
HEMOGLOBIN: 10.5 g/dL — AB (ref 12.0–15.0)
MCH: 28.1 pg (ref 26.0–34.0)
MCHC: 31.3 g/dL (ref 30.0–36.0)
MCV: 89.8 fL (ref 78.0–100.0)
Platelets: 233 10*3/uL (ref 150–400)
RBC: 3.74 MIL/uL — AB (ref 3.87–5.11)
RDW: 13.6 % (ref 11.5–15.5)
WBC: 8.1 10*3/uL (ref 4.0–10.5)

## 2017-11-03 LAB — GLUCOSE, CAPILLARY
GLUCOSE-CAPILLARY: 201 mg/dL — AB (ref 70–99)
GLUCOSE-CAPILLARY: 169 mg/dL — AB (ref 70–99)
GLUCOSE-CAPILLARY: 188 mg/dL — AB (ref 70–99)
Glucose-Capillary: 151 mg/dL — ABNORMAL HIGH (ref 70–99)

## 2017-11-03 LAB — BASIC METABOLIC PANEL
ANION GAP: 9 (ref 5–15)
BUN: 6 mg/dL — ABNORMAL LOW (ref 8–23)
CALCIUM: 9.2 mg/dL (ref 8.9–10.3)
CHLORIDE: 103 mmol/L (ref 98–111)
CO2: 26 mmol/L (ref 22–32)
Creatinine, Ser: 0.88 mg/dL (ref 0.44–1.00)
Glucose, Bld: 192 mg/dL — ABNORMAL HIGH (ref 70–99)
POTASSIUM: 3.8 mmol/L (ref 3.5–5.1)
Sodium: 138 mmol/L (ref 135–145)

## 2017-11-03 LAB — URINE CULTURE: Culture: NO GROWTH

## 2017-11-03 MED ORDER — METFORMIN HCL 500 MG PO TABS
1000.0000 mg | ORAL_TABLET | Freq: Every day | ORAL | Status: DC
  Administered 2017-11-04 – 2017-11-05 (×2): 1000 mg via ORAL
  Filled 2017-11-03 (×2): qty 2

## 2017-11-03 MED ORDER — MORPHINE SULFATE (PF) 2 MG/ML IV SOLN: 1.0000 mg | INTRAVENOUS | Status: DC | PRN

## 2017-11-03 MED ORDER — METFORMIN HCL 500 MG PO TABS
500.0000 mg | ORAL_TABLET | Freq: Every day | ORAL | Status: DC
  Administered 2017-11-03 – 2017-11-04 (×2): 500 mg via ORAL
  Filled 2017-11-03 (×2): qty 1

## 2017-11-03 MED ORDER — METFORMIN HCL 500 MG PO TABS: 500.0000 mg | ORAL_TABLET | ORAL | Status: DC

## 2017-11-03 MED ORDER — METHOCARBAMOL 500 MG PO TABS
500.0000 mg | ORAL_TABLET | Freq: Three times a day (TID) | ORAL | Status: DC | PRN
  Administered 2017-11-03 – 2017-11-05 (×4): 500 mg via ORAL
  Filled 2017-11-03 (×4): qty 1

## 2017-11-03 MED ORDER — ACETAMINOPHEN 325 MG PO TABS
650.0000 mg | ORAL_TABLET | Freq: Three times a day (TID) | ORAL | Status: DC
  Administered 2017-11-03 – 2017-11-05 (×7): 650 mg via ORAL
  Filled 2017-11-03 (×7): qty 2

## 2017-11-03 NOTE — Plan of Care (Signed)

## 2017-11-03 NOTE — Progress Notes (Signed)
Orthopedic Trauma Service Progress Note Weekend Coverage   Patient ID: Kristin Farley MRN: [id number] DOB/AGE: 11-04-1945 72 y.o.  Subjective:  Doing ok this am  Some issues with pain control  Dilaudid was making her very nauseated   Has been up with therapy  Was in CPM this am and ranged to about 60 degrees  + voiding without difficulty + flatus  No BM (last BM was Wednesday)  Plan to go to [facility] on Monday    Review of Systems  Constitutional: Negative for chills and fever.  Respiratory: Negative for shortness of breath and wheezing.   Cardiovascular: Negative for chest pain and palpitations.  Gastrointestinal: Negative for abdominal pain, nausea and vomiting.  Neurological: Negative for tingling and sensory change.    Objective:   VITALS:   Vitals:   11/02/17 1950 11/02/17 2335 11/03/17 0405 11/03/17 0853  BP: (!) 144/77 136/76 (!) 154/81 (!) 161/81  Pulse: 79 96 93 91  Resp: 14 15 15 17   Temp: 98.2 F (36.8 C) 99 F (37.2 C) 98.8 F (37.1 C) 99 F (37.2 C)  TempSrc: Oral Oral Oral Oral  SpO2: 97% 99% 99% 100%  Weight:      Height:        Estimated body mass index is 33.48 kg/m as calculated from the following:   Height as of this encounter: 5\' 3"  (1.6 m).   Weight as of this encounter: 85.7 kg (189 lb).   Intake/Output      07/26 0701 - 07/27 0700 07/27 0701 - 07/28 0700   P.O. 120    I.V. (mL/kg) 2330 (27.2)    IV Piggyback 1716.7    Total Intake(mL/kg) 4166.7 (48.6)    Urine (mL/kg/hr) 250    Blood 100    Total Output 350    Net +3816.7         Urine Occurrence 4 x 2 x     LABS  Results for orders placed or performed during the hospital encounter of 11/02/17 (from the past 24 hour(s))  Urine Culture     Status: None   Collection Time: 11/02/17 12:33 PM  Result Value Ref Range   Specimen Description IN/OUT CATH URINE    Special Requests NONE    Culture      NO GROWTH Performed at [facility]  Lab, [address], [place], [place] [address]    Report Status 11/03/2017 FINAL   Urinalysis, Routine w reflex microscopic     Status: None   Collection Time: 11/02/17 12:34 PM  Result Value Ref Range   Color, Urine YELLOW YELLOW   APPearance CLEAR CLEAR   Specific Gravity, Urine 1.023 1.005 - 1.030   pH 5.0 5.0 - 8.0   Glucose, UA NEGATIVE NEGATIVE mg/dL   Hgb urine dipstick NEGATIVE NEGATIVE   Bilirubin Urine NEGATIVE NEGATIVE   Ketones, ur NEGATIVE NEGATIVE mg/dL   Protein, ur NEGATIVE NEGATIVE mg/dL   Nitrite NEGATIVE NEGATIVE   Leukocytes, UA NEGATIVE NEGATIVE  Glucose, capillary     Status: Abnormal   Collection Time: 11/02/17  1:07 PM  Result Value Ref Range   Glucose-Capillary 117 (H) 70 - 99 mg/dL  Glucose, capillary     Status: Abnormal   Collection Time: 11/02/17  4:46 PM  Result Value Ref Range   Glucose-Capillary 237 (H) 70 - 99 mg/dL  Glucose, capillary     Status: Abnormal   Collection Time: 11/02/17  8:13 PM  Result Value Ref Range  Glucose-Capillary 177 (H) 70 - 99 mg/dL  CBC     Status: Abnormal   Collection Time: 11/03/17  4:12 AM  Result Value Ref Range   WBC 8.1 4.0 - 10.5 K/uL   RBC 3.74 (L) 3.87 - 5.11 MIL/uL   Hemoglobin 10.5 (L) 12.0 - 15.0 g/dL   HCT 16.1 (L) 09.6 - 04.5 %   MCV 89.8 78.0 - 100.0 fL   MCH 28.1 26.0 - 34.0 pg   MCHC 31.3 30.0 - 36.0 g/dL   RDW 40.9 81.1 - 91.4 %   Platelets 233 150 - 400 K/uL  Basic metabolic panel     Status: Abnormal   Collection Time: 11/03/17  4:12 AM  Result Value Ref Range   Sodium 138 135 - 145 mmol/L   Potassium 3.8 3.5 - 5.1 mmol/L   Chloride 103 98 - 111 mmol/L   CO2 26 22 - 32 mmol/L   Glucose, Bld 192 (H) 70 - 99 mg/dL   BUN 6 (L) 8 - 23 mg/dL   Creatinine, Ser 7.82 0.44 - 1.00 mg/dL   Calcium 9.2 8.9 - 95.6 mg/dL   GFR calc non Af Amer >60 >60 mL/min   GFR calc Af Amer >60 >60 mL/min   Anion gap 9 5 - 15  Glucose, capillary     Status: Abnormal   Collection Time: 11/03/17  6:39 AM  Result  Value Ref Range   Glucose-Capillary 201 (H) 70 - 99 mg/dL     PHYSICAL EXAM:   Gen: resting comfortably in  Bed, NAD, appears well Lungs: CTA  B Cardiac: RRR, s1 and s2 Abd: + BS, NT Ext:       Left Lower Extremity              Dressing c/d/i             Ext warm              + DP pulse             Swelling controlled             EHL, FHL, AT, PT, peroneals, gastroc motor intact             DPN, SPN, TN sensation intact             + Quad set             No DCT              Compartments are soft     Assessment/Plan: 1 Day Post-Op   Active Problems:   S/P knee replacement   Type II diabetes mellitus (HCC)   Primary localized osteoarthritis of knee   GERD (gastroesophageal reflux disease)   OSA (obstructive sleep apnea)   MVP (mitral valve prolapse)   Anti-infectives (From admission, onward)   Start     Dose/Rate Route Frequency Ordered Stop   11/02/17 1700  ceFAZolin (ANCEF) IVPB 2g/100 mL premix     2 g 200 mL/hr over 30 Minutes Intravenous Every 6 hours 11/02/17 1428 11/02/17 2245   11/02/17 0930  ceFAZolin (ANCEF) IVPB 2g/100 mL premix     2 g 200 mL/hr over 30 Minutes Intravenous To ShortStay Surgical 11/01/17 1410 11/02/17 1045    .  POD/HD#: 1  72 y/o female with endstage djd L knee s/p L TKA  -endstage DJD L knee s/p L TKA  WBAT  PT/OT  CPM  Dressing change tomorrow  Ice  and elevate  Total knee precautions- no pillows under bend of knee at rest    SNF Monday   - Pain management:  Dc dilaudid  Schedule tylenol 650 mg po q8h  Oxy IR 5-10 mg PO q4h prn pain  Add robaxin 500 mg po q8h prn spasms   - ABL anemia/Hemodynamics  BP a little elevated   Monitor   Likely due to pain    Home meds reordered   - Medical issues   HTN   As above   DM   SSI ordered   Restart metformin   - DVT/PE prophylaxis:  ASA 81 po BID  - ID:   periop abx completed    - Activity:  WBAT L leg  - FEN/GI prophylaxis/Foley/Lines:  CHO mod  diet  protonix   IVF  - Dispo:  PT/OT  SNF likely Monday   Anticipated LOS equal to or greater than 2 midnights due to - Age 71 and older with one or more of the following:  - Obesity  - Expected need for hospital services (PT, OT, Nursing) required for safe  discharge  - Anticipated need for postoperative skilled nursing care or inpatient rehab  - Active co-morbidities: Diabetes and Respiratory Failure/COPD OR   - Unanticipated findings during/Post Surgery: Slow post-op progression: GI, pain control, mobility  - Patient is a high risk of re-admission due to: None   Weightbearing: WBAT LLE Insicional and dressing care: Daily dressing changes with adaptic 4x4's and TED hose starting 11/04/2017 Orthopedic device(s): CPM, walker Showering: per dc instructions VTE prophylaxis: Aspirin 81mg  BID x 30 days  Pain control: tylenol, oxy IR Follow - up plan: 2 weeks Contact information:  Kristeen Missan Caffrey MD, Josh Chadwell PA-C    Mearl LatinKeith W. Jordy Verba, PA-C [facility] [phone] (P) [phone] Traci Sermon(O) [phone] (C) 11/03/2017, 11:19 AM

## 2017-11-03 NOTE — Progress Notes (Signed)
Physical Therapy Treatment Patient Details Name: Kristin Farley MRN: [id number] DOB: 1946-02-13 Today's Date: 11/03/2017    History of Present Illness 72 y.o. female s/p L TKA 11/02/17.     PT Comments    Pt performed gait training and functional mobility with emphasis on progression to step through pattern.  Pt slow and guarded and required cues for safety.  Pt performed supine exercises with active assisted ROM due to pain and weakness.  Plan for SNF remains appropriate to improve strength and functional mobility before return home.     Follow Up Recommendations  Follow surgeon's recommendation for DC plan and follow-up therapies     Equipment Recommendations       Recommendations for Other Services       Precautions / Restrictions Precautions Precautions: Knee Precaution Booklet Issued: Yes (comment) Precaution Comments: reveiwed supine therex and no knee under pillow Restrictions Weight Bearing Restrictions: Yes LLE Weight Bearing: Weight bearing as tolerated    Mobility  Bed Mobility Overal bed mobility: Needs Assistance Bed Mobility: Supine to Sit     Supine to sit: Supervision     General bed mobility comments: increased time and effort  Transfers Overall transfer level: Needs assistance Equipment used: Rolling walker (2 wheeled) Transfers: Sit to/from [employer] Sit to Stand: Min guard         General transfer comment: cues for hand placement, min A to power up from neutral height  Ambulation/Gait Ambulation/Gait assistance: Min guard Gait Distance (Feet): 60 Feet Assistive device: Rolling walker (2 wheeled) Gait Pattern/deviations: Step-to pattern;Step-through pattern;Antalgic Gait velocity: decreased   General Gait Details: cues for sequencning, pt min guard for safety. step to gait pattern with progression to step through pattern.     Stairs             Wheelchair Mobility    Modified Rankin (Stroke Patients  Only)       Balance Overall balance assessment: Needs assistance   Sitting balance-Leahy Scale: Good       Standing balance-Leahy Scale: Poor                              Cognition Arousal/Alertness: Awake/alert Behavior During Therapy: WFL for tasks assessed/performed Overall Cognitive Status: Within Functional Limits for tasks assessed                                        Exercises Total Joint Exercises Ankle Circles/Pumps: AROM;Both;20 reps;Supine Quad Sets: AROM;Left;10 reps;Supine Gluteal Sets: AROM;10 reps;Left;Supine Heel Slides: Left;10 reps;Supine;AAROM Hip ABduction/ADduction: Left;10 reps;Supine;AAROM Straight Leg Raises: AAROM;10 reps;Supine;Left Goniometric ROM: 10-70 degrees flexion.      General Comments        Pertinent Vitals/Pain Pain Assessment: 0-10 Pain Score: 6  Pain Location: L knee Pain Descriptors / Indicators: Aching;Operative site guarding Pain Intervention(s): Monitored during session;Repositioned;Ice applied    Home Living                      Prior Function            PT Goals (current goals can now be found in the care plan section) Acute Rehab PT Goals Patient Stated Goal: go to SNF Potential to Achieve Goals: Good Progress towards PT goals: Progressing toward goals    Frequency    7X/week  PT Plan Current plan remains appropriate    Co-evaluation              AM-PAC PT "6 Clicks" Daily Activity  Outcome Measure  Difficulty turning over in bed (including adjusting bedclothes, sheets and blankets)?: Unable Difficulty moving from lying on back to sitting on the side of the bed? : Unable Difficulty sitting down on and standing up from a chair with arms (e.g., wheelchair, bedside commode, etc,.)?: Unable Help needed moving to and from a bed to chair (including a wheelchair)?: A Little Help needed walking in hospital room?: A Little Help needed climbing 3-5 steps with  a railing? : A Little 6 Click Score: 12    End of Session Equipment Utilized During Treatment: Gait belt Activity Tolerance: Patient tolerated treatment well Patient left: in bed;with call bell/phone within reach;with family/visitor present;with nursing/sitter in room(nursing changing IV dressing so pt left in her care with bed height elevated.  ) Nurse Communication: Mobility status PT Visit Diagnosis: Unsteadiness on feet (R26.81);Pain Pain - Right/Left: Left Pain - part of body: Knee     Time: 1430-1455 PT Time Calculation (min) (ACUTE ONLY): 25 min  Charges:  $Gait Training: 8-22 mins $Therapeutic Exercise: 8-22 mins                     Joycelyn RuaAimee Keirah Konitzer, PTA pager [phone]    Florestine AversAimee J Melonie Germani 11/03/2017, 3:20 PM

## 2017-11-03 NOTE — Plan of Care (Signed)
Problem: Education: Goal: Knowledge of General Education information will improve Description Including pain rating scale, medication(s)/side effects and non-pharmacologic comfort measures Outcome: Progressing   Problem: Clinical Measurements: Goal: Will remain free from infection Outcome: Progressing   Problem: Coping: Goal: Level of anxiety will decrease Outcome: Progressing   Problem: Elimination: Goal: Will not experience complications related to urinary retention Outcome: Progressing   Problem: Education: Goal: Knowledge of the prescribed therapeutic regimen will improve Outcome: Progressing   Problem: Activity: Goal: Range of joint motion will improve Outcome: Progressing

## 2017-11-03 NOTE — Plan of Care (Signed)

## 2017-11-04 ENCOUNTER — Encounter (HOSPITAL_COMMUNITY): Payer: Self-pay | Admitting: *Deleted

## 2017-11-04 LAB — CBC
HCT: 33.3 % — ABNORMAL LOW (ref 36.0–46.0)
Hemoglobin: 10.3 g/dL — ABNORMAL LOW (ref 12.0–15.0)
MCH: 27.8 pg (ref 26.0–34.0)
MCHC: 30.9 g/dL (ref 30.0–36.0)
MCV: 90 fL (ref 78.0–100.0)
PLATELETS: 210 10*3/uL (ref 150–400)
RBC: 3.7 MIL/uL — AB (ref 3.87–5.11)
RDW: 13.9 % (ref 11.5–15.5)
WBC: 7.8 10*3/uL (ref 4.0–10.5)

## 2017-11-04 LAB — COMPREHENSIVE METABOLIC PANEL
ALBUMIN: 3.2 g/dL — AB (ref 3.5–5.0)
ALT: 13 U/L (ref 0–44)
AST: 12 U/L — AB (ref 15–41)
Alkaline Phosphatase: 41 U/L (ref 38–126)
Anion gap: 7 (ref 5–15)
CHLORIDE: 106 mmol/L (ref 98–111)
CO2: 25 mmol/L (ref 22–32)
CREATININE: 0.74 mg/dL (ref 0.44–1.00)
Calcium: 9.1 mg/dL (ref 8.9–10.3)
GFR calc Af Amer: >60 mL/min (ref >60)
GFR calc non Af Amer: >60 mL/min (ref >60)
GLUCOSE: 176 mg/dL — AB (ref 70–99)
Potassium: 3.8 mmol/L (ref 3.5–5.1)
SODIUM: 138 mmol/L (ref 135–145)
Total Bilirubin: 0.7 mg/dL (ref 0.3–1.2)
Total Protein: 6.1 g/dL — ABNORMAL LOW (ref 6.5–8.1)

## 2017-11-04 LAB — GLUCOSE, CAPILLARY
GLUCOSE-CAPILLARY: 147 mg/dL — AB (ref 70–99)
GLUCOSE-CAPILLARY: 137 mg/dL — AB (ref 70–99)
Glucose-Capillary: 159 mg/dL — ABNORMAL HIGH (ref 70–99)
Glucose-Capillary: 75 mg/dL (ref 70–99)
Glucose-Capillary: 176 mg/dL — ABNORMAL HIGH (ref 70–99)

## 2017-11-04 NOTE — Progress Notes (Signed)
Orthopedic Trauma Service Progress Note weekend coverage   Patient ID: Kristin Farley MRN: [id number] DOB/AGE: 1945/07/15 72 y.o.  Subjective:  Doing very well this am  Pain improved with changes  Working well with therapy  No acute issues noted   Some HTN noted yesterday, elevated bp noted before pt received her pm dose of atenolol. Pt w/o any active complaints   cbgs are better as well   Review of Systems  Constitutional: Negative for chills and fever.  Respiratory: Negative for shortness of breath and wheezing.   Cardiovascular: Negative for chest pain and palpitations.  Gastrointestinal: Negative for abdominal pain, nausea and vomiting.  Neurological: Negative for tingling and sensory change.    Objective:   VITALS:   Vitals:   11/03/17 0853 11/03/17 1348 11/03/17 2019 11/04/17 0422  BP: (!) 161/81 136/90 (!) 176/98 (!) 151/81  Pulse: 91 96 (!) 104 90  Resp: 17 17 16 16   Temp: 99 F (37.2 C) 98.5 F (36.9 C) 99.8 F (37.7 C) 99.4 F (37.4 C)  TempSrc: Oral Oral Oral Oral  SpO2: 100% 97% 100% 97%  Weight:      Height:        Estimated body mass index is 33.48 kg/m as calculated from the following:   Height as of this encounter: 5\' 3"  (1.6 m).   Weight as of this encounter: 85.7 kg (189 lb).   Intake/Output      07/27 0701 - 07/28 0700 07/28 0701 - 07/29 0700   P.O. 240    I.V. (mL/kg)     IV Piggyback     Total Intake(mL/kg) 240 (2.8)    Urine (mL/kg/hr)     Blood     Total Output     Net +240         Urine Occurrence 7 x      LABS  Results for orders placed or performed during the hospital encounter of 11/02/17 (from the past 24 hour(s))  Glucose, capillary     Status: Abnormal   Collection Time: 11/03/17 11:31 AM  Result Value Ref Range   Glucose-Capillary 169 (H) 70 - 99 mg/dL  Glucose, capillary     Status: Abnormal   Collection Time: 11/03/17  4:25 PM  Result Value Ref Range   Glucose-Capillary 188 (H) 70 -  99 mg/dL  Glucose, capillary     Status: Abnormal   Collection Time: 11/03/17  9:40 PM  Result Value Ref Range   Glucose-Capillary 151 (H) 70 - 99 mg/dL  CBC     Status: Abnormal   Collection Time: 11/04/17  3:13 AM  Result Value Ref Range   WBC 7.8 4.0 - 10.5 K/uL   RBC 3.70 (L) 3.87 - 5.11 MIL/uL   Hemoglobin 10.3 (L) 12.0 - 15.0 g/dL   HCT 78.2 (L) 95.6 - 21.3 %   MCV 90.0 78.0 - 100.0 fL   MCH 27.8 26.0 - 34.0 pg   MCHC 30.9 30.0 - 36.0 g/dL   RDW 08.6 57.8 - 46.9 %   Platelets 210 150 - 400 K/uL  Comprehensive metabolic panel     Status: Abnormal   Collection Time: 11/04/17  3:13 AM  Result Value Ref Range   Sodium 138 135 - 145 mmol/L   Potassium 3.8 3.5 - 5.1 mmol/L   Chloride 106 98 - 111 mmol/L   CO2 25 22 - 32 mmol/L   Glucose, Bld 176 (H) 70 - 99 mg/dL   BUN <5 (L) 8 -  23 mg/dL   Creatinine, Ser 1.610.74 0.44 - 1.00 mg/dL   Calcium 9.1 8.9 - 09.610.3 mg/dL   Total Protein 6.1 (L) 6.5 - 8.1 g/dL   Albumin 3.2 (L) 3.5 - 5.0 g/dL   AST 12 (L) 15 - 41 U/L   ALT 13 0 - 44 U/L   Alkaline Phosphatase 41 38 - 126 U/L   Total Bilirubin 0.7 0.3 - 1.2 mg/dL   GFR calc non Af Amer >60 >60 mL/min   GFR calc Af Amer >60 >60 mL/min   Anion gap 7 5 - 15  Glucose, capillary     Status: Abnormal   Collection Time: 11/04/17  6:43 AM  Result Value Ref Range   Glucose-Capillary 159 (H) 70 - 99 mg/dL     PHYSICAL EXAM:   Gen: in bedside chair, NAD, good spirits, pleasant Lungs: CTA  B, breathing unlabored Cardiac: RRR, s1 and s2 Abd: + BS, NT Ext:       Left Lower Extremity              Dressing removed   Incision looks great   No drainage   No signs of infection    Swelling as expected              Ext warm              + DP pulse             EHL, FHL, AT, PT, peroneals, gastroc motor intact             DPN, SPN, TN sensation intact             + Quad set             No DCT              Compartments are soft   Assessment/Plan: 2 Days Post-Op   Principal Problem:    Primary localized osteoarthritis of knee Active Problems:   S/P knee replacement   Type II diabetes mellitus (HCC)   GERD (gastroesophageal reflux disease)   OSA (obstructive sleep apnea)   MVP (mitral valve prolapse)   Anti-infectives (From admission, onward)   Start     Dose/Rate Route Frequency Ordered Stop   11/02/17 1700  ceFAZolin (ANCEF) IVPB 2g/100 mL premix     2 g 200 mL/hr over 30 Minutes Intravenous Every 6 hours 11/02/17 1428 11/02/17 2245   11/02/17 0930  ceFAZolin (ANCEF) IVPB 2g/100 mL premix     2 g 200 mL/hr over 30 Minutes Intravenous To ShortStay Surgical 11/01/17 1410 11/02/17 1045    .  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             WBAT             PT/OT             CPM             Dressing changed today   Daily changes as needed   Ok to shower and clean wound with soap and water   TED hose              Ice and elevate             Total knee precautions- no pillows under bend of knee at rest  SNF tomorrow    - Pain management:             continue with current regimen of               Scheduled tylenol 650 mg po q8h              Oxy IR 5-10 mg PO q4h prn pain              robaxin 500 mg po q8h prn spasms    - ABL anemia/Hemodynamics             BP a little elevated                         Monitor                         continue home meds      - Medical issues              HTN                         As above               DM                         SSI and metformin    - DVT/PE prophylaxis:             ASA 81 po BID  - ID:              periop abx completed      - Activity:             WBAT L leg   - FEN/GI prophylaxis/Foley/Lines:             CHO mod diet             protonix              dc IVF and IV    - Dispo:             PT/OT             SNF tomorrow     Anticipated LOS equal to or greater than 2 midnights due to - Age 90 and  older with one or more of the following:  - Obesity  - Expected need for hospital services (PT, OT, Nursing) required for safe  discharge  - Anticipated need for postoperative skilled nursing care or inpatient rehab  - Active co-morbidities: Diabetes and Respiratory Failure/COPD OR   - Unanticipated findings during/Post Surgery: Slow post-op progression: GI, pain control, mobility  - Patient is a high risk of re-admission due to: None   Weightbearing: WBAT LLE Insicional and dressing care: Daily dressing changes with dry dressing and TED hose  Orthopedic device(s): CPM, walker  Showering: ok to shower, clean only with soap and water  VTE prophylaxis: Aspirin 81mg  BID 4 weeks  Pain control: tylenol, oxy IR  Follow - up plan: 2 weeks Contact information:  Baltazar Apo MD, Josh Chadwell PA-C   Mearl Latin, PA-C [facility] [phone] Traci Sermon (C) 11/04/2017, 9:43 AM

## 2017-11-04 NOTE — Plan of Care (Signed)
Problem: Health Behavior/Discharge Planning: Goal: Ability to manage health-related needs will improve Outcome: Progressing   Problem: Clinical Measurements: Goal: Ability to maintain clinical measurements within normal limits will improve Outcome: Progressing Goal: Respiratory complications will improve Outcome: Progressing   Problem: Activity: Goal: Ability to avoid complications of mobility impairment will improve Outcome: Progressing   Problem: Pain Management: Goal: Pain level will decrease with appropriate interventions Outcome: Progressing

## 2017-11-04 NOTE — Progress Notes (Signed)
Physical Therapy Treatment Patient Details Name: Kristin Farley MRN: [id number] DOB: 1945-10-23 Today's Date: 11/04/2017    History of Present Illness 72 y.o. female s/p L TKA 11/02/17. PHMx: DM, GERD, Heart murmur, MVP, OSA.     PT Comments    Patient doing well with therapy today, session focused on improving gait mechanics, pt now with step through pattern. Pt also progressing level of independence with transfers and walking. Reinforced therex. Feel she will do very well at SNF and be able to safely return home.      Follow Up Recommendations  Follow surgeon's recommendation for DC plan and follow-up therapies     Equipment Recommendations       Recommendations for Other Services       Precautions / Restrictions Precautions Precautions: Knee Precaution Booklet Issued: No Restrictions Weight Bearing Restrictions: Yes LLE Weight Bearing: Weight bearing as tolerated    Mobility  Bed Mobility Overal bed mobility: Needs Assistance Bed Mobility: Supine to Sit     Supine to sit: Min assist     General bed mobility comments: Light min assist for LLE to EOB.  Transfers Overall transfer level: Needs assistance Equipment used: Rolling walker (2 wheeled) Transfers: Sit to/from Stand Sit to Stand: Min guard Stand pivot transfers: Min guard       General transfer comment: Cues for hand placement, min guard for safety pt able to perform without physical assist  Ambulation/Gait Ambulation/Gait assistance: Min guard;Supervision Gait Distance (Feet): 85 Feet Assistive device: Rolling walker (2 wheeled) Gait Pattern/deviations: Step-to pattern;Step-through pattern;Antalgic Gait velocity: decreased   General Gait Details: Cues for heel strike, progressing swing on R leg prolonging WB on LLE, pt tolerating well, intermittent step through gait   Stairs             Wheelchair Mobility    Modified Rankin (Stroke Patients Only)       Balance Overall balance  assessment: Needs assistance Sitting-balance support: Feet supported;No upper extremity supported Sitting balance-Leahy Scale: Good     Standing balance support: No upper extremity supported;During functional activity Standing balance-Leahy Scale: Fair Standing balance comment: Static standing                            Cognition Arousal/Alertness: Awake/alert Behavior During Therapy: WFL for tasks assessed/performed Overall Cognitive Status: Within Functional Limits for tasks assessed                                        Exercises Total Joint Exercises Long Arc Quad: 10 reps Knee Flexion: 10 reps Goniometric ROM: 93* flexion    General Comments        Pertinent Vitals/Pain Pain Assessment: Faces Faces Pain Scale: Hurts even more Pain Location: L knee at end of session Pain Descriptors / Indicators: Discomfort;Aching Pain Intervention(s): Limited activity within patient's tolerance;Monitored during session;Premedicated before session    Home Living Family/patient expects to be discharged to:: Skilled nursing facility Living Arrangements: Other relatives                  Prior Function Level of Independence: Independent with assistive device(s)      Comments: Gilmer Mor for mobility PTA; independent with ADL   PT Goals (current goals can now be found in the care plan section) Acute Rehab PT Goals Patient Stated Goal: go to SNF PT Goal Formulation:  With patient Time For Goal Achievement: 11/09/17 Potential to Achieve Goals: Good Progress towards PT goals: Progressing toward goals    Frequency    7X/week      PT Plan Current plan remains appropriate    Co-evaluation              AM-PAC PT "6 Clicks" Daily Activity  Outcome Measure  Difficulty turning over in bed (including adjusting bedclothes, sheets and blankets)?: Unable Difficulty moving from lying on back to sitting on the side of the bed? : Unable Difficulty  sitting down on and standing up from a chair with arms (e.g., wheelchair, bedside commode, etc,.)?: Unable Help needed moving to and from a bed to chair (including a wheelchair)?: A Little Help needed walking in hospital room?: A Little Help needed climbing 3-5 steps with a railing? : A Little 6 Click Score: 12    End of Session Equipment Utilized During Treatment: Gait belt Activity Tolerance: Patient tolerated treatment well Patient left: in bed;with call bell/phone within reach;with family/visitor present;with nursing/sitter in room Nurse Communication: Mobility status PT Visit Diagnosis: Unsteadiness on feet (R26.81);Pain Pain - Right/Left: Left Pain - part of body: Knee     Time: 1345-1415 PT Time Calculation (min) (ACUTE ONLY): 30 min  Charges:  $Gait Training: 23-37 mins                     Etta GrandchildSean Mekiyah Gladwell, PT, DPT Acute Rehab Services Pager: [phone]    Etta GrandchildSean Rajendra Spiller 11/04/2017, 2:21 PM

## 2017-11-04 NOTE — Evaluation (Signed)
Occupational Therapy Evaluation Patient Details Name: Kristin Farley MRN: [id number] DOB: 11-28-1945 Today's Date: 11/04/2017    History of Present Illness 72 y.o. female s/p L TKA 11/02/17. PHMx: DM, GERD, Heart murmur, MVP, OSA.    Clinical Impression   Pt reports she was independent with ADL PTA. Currently pt requires min guard assist for functional mobility and ADL standing at the sink; does require min assist for LB ADL. Recommending SNF for follow up to maximize independence and safety with ADL and functional mobility prior to return home. Pt would benefit from continued skilled OT to address established goals.    Follow Up Recommendations  SNF    Equipment Recommendations  Other (comment)(TBD at next venue)    Recommendations for Other Services       Precautions / Restrictions Precautions Precautions: Knee Precaution Booklet Issued: No Restrictions Weight Bearing Restrictions: Yes LLE Weight Bearing: Weight bearing as tolerated      Mobility Bed Mobility Overal bed mobility: Needs Assistance Bed Mobility: Supine to Sit     Supine to sit: Min assist     General bed mobility comments: Light min assist for LLE to EOB.  Transfers Overall transfer level: Needs assistance Equipment used: Rolling walker (2 wheeled) Transfers: Sit to/from Stand Sit to Stand: Min guard         General transfer comment: Cues for hand placement, min guard for safety pt able to perform without physical assist    Balance Overall balance assessment: Needs assistance Sitting-balance support: Feet supported;No upper extremity supported Sitting balance-Leahy Scale: Good     Standing balance support: No upper extremity supported;During functional activity Standing balance-Leahy Scale: Fair Standing balance comment: Static standing                           ADL either performed or assessed with clinical judgement   ADL Overall ADL's : Needs  assistance/impaired Eating/Feeding: Independent;Sitting   Grooming: Min guard;Standing;Wash/dry hands;Wash/dry face;Applying deodorant;Oral care   Upper Body Bathing: Min guard;Standing   Lower Body Bathing: Minimal assistance;Sit to/from stand   Upper Body Dressing : Min guard;Standing   Lower Body Dressing: Minimal assistance;Sit to/from stand Lower Body Dressing Details (indicate cue type and reason): Min assist to start brief over L foot. Educated on compensatory strategies Toilet Transfer: Min guard;Ambulation;BSC;RW   Toileting- ArchitectClothing Manipulation and Hygiene: Min guard;Sit to/from stand       Functional mobility during ADLs: Hydrographic surveyorMin guard;Rolling walker       Vision         Perception     Praxis      Pertinent Vitals/Pain Pain Assessment: Faces Faces Pain Scale: Hurts even more Pain Location: L knee at end of session Pain Descriptors / Indicators: Discomfort;Aching Pain Intervention(s): Monitored during session;Repositioned;Patient requesting pain meds-RN notified;Ice applied     Hand Dominance Right   Extremity/Trunk Assessment Upper Extremity Assessment Upper Extremity Assessment: Overall WFL for tasks assessed   Lower Extremity Assessment Lower Extremity Assessment: Defer to PT evaluation       Communication Communication Communication: No difficulties   Cognition Arousal/Alertness: Awake/alert Behavior During Therapy: WFL for tasks assessed/performed Overall Cognitive Status: Within Functional Limits for tasks assessed                                     General Comments       Exercises  Shoulder Instructions      Home Living Family/patient expects to be discharged to:: Skilled nursing facility Living Arrangements: Other relatives                                      Prior Functioning/Environment Level of Independence: Independent with assistive device(s)        Comments: Kristin Farley for mobility PTA;  independent with ADL        OT Problem List: Decreased range of motion;Impaired balance (sitting and/or standing);Decreased activity tolerance;Decreased knowledge of use of DME or AE;Decreased knowledge of precautions;Pain      OT Treatment/Interventions: Self-care/ADL training;Energy conservation;DME and/or AE instruction;Therapeutic activities;Patient/family education;Balance training    OT Goals(Current goals can be found in the care plan section) Acute Rehab OT Goals Patient Stated Goal: go to SNF OT Goal Formulation: With patient/family Time For Goal Achievement: 11/18/17 Potential to Achieve Goals: Good ADL Goals Pt Will Perform Lower Body Bathing: with modified independence;sit to/from stand Pt Will Perform Lower Body Dressing: with modified independence;sit to/from stand Pt Will Perform Tub/Shower Transfer: Tub transfer;with modified independence;ambulating;3 in 1;rolling walker  OT Frequency: Min 2X/week   Barriers to D/C:            Co-evaluation              AM-PAC PT "6 Clicks" Daily Activity     Outcome Measure Help from another person eating meals?: None Help from another person taking care of personal grooming?: A Little Help from another person toileting, which includes using toliet, bedpan, or urinal?: A Little Help from another person bathing (including washing, rinsing, drying)?: A Little Help from another person to put on and taking off regular upper body clothing?: A Little Help from another person to put on and taking off regular lower body clothing?: A Little 6 Click Score: 19   End of Session Equipment Utilized During Treatment: Rolling walker [name] [name]: Off Nurse Communication: Mobility status;Patient requests pain meds(RN tech)  Activity Tolerance: Patient tolerated treatment well Patient left: in chair;with call bell/phone within reach;with family/visitor present  OT Visit Diagnosis: Other abnormalities of gait and  mobility (R26.89);Pain Pain - Right/Left: Left Pain - part of body: Knee                Time: 4098-1191 OT Time Calculation (min): 29 min Charges:  OT General Charges $OT Visit: 1 Visit OT Evaluation $OT Eval Moderate Complexity: 1 Mod OT Treatments $Self Care/Home Management : 8-22 mins  Trenita Hulme A. Brett Albino, M.S., OTR/L Acute Rehab Department: [phone]  Gaye Alken 11/04/2017, 10:31 AM

## 2017-11-04 NOTE — Clinical Social Work Note (Signed)
Clinical Social Work Assessment  Patient Details  Name: Kristin Farley MRN: [id number] Date of Birth: 12-Jan-1946  Date of referral:  11/04/17               Reason for consult:  Facility Placement                Permission sought to share information with:  Family Supports Permission granted to share information::     Name::     [facility]  [facility]::  [facility]  Relationship::  daughter  Contact Information:  [phone]  Housing/Transportation Living arrangements for the past 2 months:  Single Family Home Source of Information:  Patient Patient Interpreter Needed:  None Criminal Activity/Legal Involvement Pertinent to Current Situation/Hospitalization:  No - Comment as needed Significant Relationships:  Adult Children Lives with:  Adult Children Do you feel safe going back to the place where you live?  No Need for family participation in patient care:  No (Coment)  Care giving concerns:  Pt is alert and oriented. Pt was living with her daughter prior to admission.   Social Worker assessment / plan:  CSW spoke with pt at bedside. Pt's daughter was present. Pt is agreeable to SNF and states she [facility]. CSW has confirmed that [facility] and will d/c there. Pt will need [employer] auth prior to being admitted to the facility.  Employment status:  Retired Database administratornsurance information:  Managed Medicare PT Recommendations:  Skilled Nursing Facility Information / Referral to community resources:  Skilled Nursing Facility  Patient/Family's Response to care:  Pt verbalized understanding of CSW role and expressed appreciation for support. Pt denies any concern regarding pt care at this time.   Patient/Family's Understanding of and Emotional Response to Diagnosis, Current Treatment, and Prognosis:  Pt understanding and realistic regarding physical limitations. Pt understands the need for SNF placement at d/c.  Pt agreeable to SNF placement at d/c, at this time. Pt's responses emotionally appropriate during conversation with CSW. Pt denies any concern regarding treatment plan at this time. CSW will continue to provide support and facilitate d/c needs.   Emotional Assessment Appearance:  Appears stated age Attitude/Demeanor/Rapport:  (Patient was appropriate) Affect (typically observed):  Accepting, Appropriate Orientation:  Oriented to Self, Oriented to Place, Oriented to  Time, Oriented to Situation Alcohol / Substance use:  Not Applicable Psych involvement (Current and /or in the community):  No (Comment)  Discharge Needs  Concerns to be addressed:  Care Coordination, Basic Needs Readmission within the last 30 days:  No Current discharge risk:  Dependent with Mobility Barriers to Discharge:  Continued Medical Work up   Pacific MutualBridget A Aven Christen, LCSW 11/04/2017, 10:52 AM

## 2017-11-04 NOTE — Clinical Social Work Note (Signed)
[facility].   Velora MediateBridget Raney Koeppen, MSW [phone]

## 2017-11-05 LAB — CBC
HCT: 31.7 % — ABNORMAL LOW (ref 36.0–46.0)
Hemoglobin: 9.8 g/dL — ABNORMAL LOW (ref 12.0–15.0)
MCH: 27.8 pg (ref 26.0–34.0)
MCHC: 30.9 g/dL (ref 30.0–36.0)
MCV: 89.8 fL (ref 78.0–100.0)
Platelets: 200 10*3/uL (ref 150–400)
RBC: 3.53 MIL/uL — ABNORMAL LOW (ref 3.87–5.11)
RDW: 14 % (ref 11.5–15.5)
WBC: 7.8 10*3/uL (ref 4.0–10.5)

## 2017-11-05 LAB — GLUCOSE, CAPILLARY
GLUCOSE-CAPILLARY: 161 mg/dL — AB (ref 70–99)
Glucose-Capillary: 115 mg/dL — ABNORMAL HIGH (ref 70–99)

## 2017-11-05 NOTE — Progress Notes (Signed)
Physical Therapy Treatment Patient Details Name: Kristin Farley MRN: [id number] DOB: 01-24-1946 Today's Date: 11/05/2017    History of Present Illness 72 y.o. female s/p L TKA 11/02/17. PHMx: DM, GERD, Heart murmur, MVP, OSA.     PT Comments    Pt presented in bed upon arrival, reporting no knee pain just "soreness" in L knee. Pt continues to require min A for bed mobility in order to bring L leg EOB. Pt requested help moving R leg EOB as well demonstrating decreased effort, but was fully capable of lifting R leg over EOB without assist.  Pt required min guard for transfers with verbal cues for descent. Pt ambulated without any increase in pain, while monitoring SpO2 value which remained 95% and above through out. Pt did not tolerate LAQ, as she was unable to actively lift leg off of floor, but completed supine therapeutic exercises to tolerance. Pt will continue to be followed up acutely in order to improve overall safety and mobility.    Follow Up Recommendations  Follow surgeon's recommendation for DC plan and follow-up therapies     Equipment Recommendations       Recommendations for Other Services       Precautions / Restrictions Precautions Precautions: Knee Restrictions Weight Bearing Restrictions: Yes LLE Weight Bearing: Weight bearing as tolerated    Mobility  Bed Mobility Overal bed mobility: Needs Assistance Bed Mobility: Supine to Sit     Supine to sit: Min assist     General bed mobility comments: Min assist for LLE to EOB. Pt also requested help moving R leg EOB but pt demonstrated good ROM & strength in R leg.  Transfers Overall transfer level: Needs assistance Equipment used: Rolling walker (2 wheeled) Transfers: Sit to/from Stand Sit to Stand: Min guard         General transfer comment: Min guard for safety and technique   Ambulation/Gait Ambulation/Gait assistance: Min guard;Supervision Gait Distance (Feet): 100 Feet Assistive device: Rolling  walker (2 wheeled) Gait Pattern/deviations: Step-through pattern;Decreased stance time - right;Antalgic;Decreased stride length Gait velocity: decreased Gait velocity interpretation: <1.31 ft/sec, indicative of household ambulator General Gait Details: Pt demonstrated step through pattern with decreased heel strike on left leg   Stairs             Wheelchair Mobility    Modified Rankin (Stroke Patients Only)       Balance Overall balance assessment: Needs assistance Sitting-balance support: Feet supported;No upper extremity supported Sitting balance-Leahy Scale: Good     Standing balance support: Bilateral upper extremity supported Standing balance-Leahy Scale: Poor                              Cognition Arousal/Alertness: Awake/alert Behavior During Therapy: WFL for tasks assessed/performed Overall Cognitive Status: Within Functional Limits for tasks assessed                                        Exercises Total Joint Exercises Quad Sets: AROM;Strengthening;Left;10 reps;Supine Gluteal Sets: AROM;Strengthening;Left;10 reps;Supine Heel Slides: AROM;Left;10 reps;Supine Hip ABduction/ADduction: AROM;Strengthening;Left;10 reps;Supine Long Arc Quad: AROM;Strengthening;Left;5 reps;Seated(Pt couldn't tolerate more than 5 secondary to pain) Knee Flexion: AROM;5 reps Goniometric ROM: 11-81 degrees(Measured EOB)    General Comments        Pertinent Vitals/Pain Pain Assessment: Faces Faces Pain Scale: Hurts a little bit Pain Location: L knee at  end of session Pain Descriptors / Indicators: Sore;Grimacing Pain Intervention(s): Limited activity within patient's tolerance;Monitored during session;Ice applied    Home Living                      Prior Function            PT Goals (current goals can now be found in the care plan section) Acute Rehab PT Goals Patient Stated Goal: go to SNF PT Goal Formulation: With patient Time  For Goal Achievement: 11/09/17 Potential to Achieve Goals: Good Progress towards PT goals: Progressing toward goals    Frequency    7X/week      PT Plan Current plan remains appropriate    Co-evaluation              AM-PAC PT "6 Clicks" Daily Activity  Outcome Measure  Difficulty turning over in bed (including adjusting bedclothes, sheets and blankets)?: Unable Difficulty moving from lying on back to sitting on the side of the bed? : Unable Difficulty sitting down on and standing up from a chair with arms (e.g., wheelchair, bedside commode, etc,.)?: Unable Help needed moving to and from a bed to chair (including a wheelchair)?: A Little Help needed walking in hospital room?: A Little Help needed climbing 3-5 steps with a railing? : A Little 6 Click Score: 12    End of Session Equipment Utilized During Treatment: Gait belt Activity Tolerance: Patient limited by pain Patient left: in bed;with call bell/phone within reach Nurse Communication: Mobility status PT Visit Diagnosis: Other abnormalities of gait and mobility (R26.89);Unsteadiness on feet (R26.81);Pain Pain - Right/Left: Left Pain - part of body: Knee     Time: 1202-1229 PT Time Calculation (min) (ACUTE ONLY): 27 min  Charges:  $Gait Training: 8-22 mins $Therapeutic Exercise: 8-22 mins                     Donzetta KohutKaylee Loree Shehata, [place]  Student Physical Therapist Acute Rehab [phone]   Donzetta KohutKaylee Jarika Robben 11/05/2017, 1:30 PM

## 2017-11-05 NOTE — Care Management Important Message (Signed)
Important Message  Patient Details  Name: Kristin Farley MRN: [id number] Date of Birth: March 05, 1946   Medicare Important Message Given:  Yes    Dorena BodoIris Copelan Maultsby 11/05/2017, 3:07 PM

## 2017-11-05 NOTE — Care Management Important Message (Signed)
Important Message  Patient Details  Name: Kristin Farley MRN: [id number] Date of Birth: 1945/09/22   Medicare Important Message Given:  Yes    Elliot CousinShavis, Kacee Koren Ellen, RN 11/05/2017, 1:19 PM

## 2017-11-05 NOTE — Clinical Social Work Placement (Signed)
   CLINICAL SOCIAL WORK PLACEMENT  NOTE  Date:  11/05/2017  Patient Details  Name: Kristin Farley MRN: [id number] Date of Birth: 06-30-1945  Clinical Social Work is seeking post-discharge placement for this patient at the Skilled  Nursing Facility level of care (*CSW will initial, date and re-position this form in  chart as items are completed):      Patient/family provided with [facility] Clinical Social Work Department's list of facilities offering this level of care within the geographic area requested by the patient (or if unable, by the patient's family).  Yes   Patient/family informed of their freedom to choose among providers that offer the needed level of care, that participate in Medicare, Medicaid or managed care program needed by the patient, have an available bed and are willing to accept the patient.      Patient/family informed of [facility]'s ownership interest in [facility] and [facility], as well as of the fact that they are under no obligation to receive care at these facilities.  PASRR submitted to EDS on       PASRR number received on 11/04/17     Existing PASRR number confirmed on       FL2 transmitted to all facilities in geographic area requested by pt/family on 11/04/17     FL2 transmitted to all facilities within larger geographic area on       Patient informed that his/her managed care company has contracts with or will negotiate with certain facilities, including the following:        Yes   Patient/family informed of bed offers received.  [facility]     Physician recommends and patient chooses bed at      Patient to [facility] on 11/05/17.  Patient to be transferred to facility by [facility]     Patient family notified on 11/05/17 of transfer.  Name of family member notified:  Halicia     PHYSICIAN Please prepare priority discharge summary, including medications, Please sign FL2,  Please sign DNR     Additional Comment:    _______________________________________________ Maree KrabbeBridget A Joshue Badal, LCSW 11/05/2017, 2:25 PM

## 2017-11-05 NOTE — Progress Notes (Signed)
Discharge instructions completed with pt.  Pt verbalized understanding of the information.  Pt denies chest pain, shortness of breath, dizziness, lightheadedness, and n/v. Pt waiting on transport.

## 2017-11-05 NOTE — Clinical Social Work Note (Addendum)
Clinical Social Worker facilitated patient discharge including contacting patient family and facility to confirm patient discharge plans.  Clinical information faxed to facility and family agreeable with plan.  CSW arranged ambulance transport via [facility] to [facility].  RN to call [phone] (ask for 100 hall RN) for report prior to discharge.  Clinical Social Worker will sign off for now as social work intervention is no longer needed. Please consult us again if new need arises.  Velora MediateBridget Cahlil Sattar, MSW [phone]

## 2017-11-05 NOTE — Discharge Summary (Signed)
PATIENT ID: Kristin Farley        MRN:  [id number]          DOB/AGE: 1946/01/30 / 72 y.o.    DISCHARGE SUMMARY  ADMISSION DATE:    11/02/2017 DISCHARGE DATE:   11/05/2017   ADMISSION DIAGNOSIS: OA LEFT KNEE    DISCHARGE DIAGNOSIS:  PRIMARY LOCALIZED OSTEOARTHRITIS LEFT KNEE    ADDITIONAL DIAGNOSIS: Principal Problem:   Primary localized osteoarthritis of knee Active Problems:   S/P knee replacement   Type II diabetes mellitus (HCC)   GERD (gastroesophageal reflux disease)   OSA (obstructive sleep apnea)   MVP (mitral valve prolapse)  Past Medical History:  Diagnosis Date  . Anxiety   . Arthritis    "shoulders" (11/02/2017)  . Frequency of urination   . GERD (gastroesophageal reflux disease)   . Heart murmur   . High cholesterol   . Migraines    "q 2-3 months" (11/02/2017)  . MVP (mitral valve prolapse)   . OSA (obstructive sleep apnea)    "need new mask" (11/02/2017)  . Primary localized osteoarthritis of knee    Left  . SOB (shortness of breath) on exertion   . Stress incontinence   . Type II diabetes mellitus (HCC)   . 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 Urgency of urination     PROCEDURE: Procedure(s): LEFT TOTAL KNEE ARTHROPLASTY Left on 11/02/2017  CONSULTS: PT/OT/SW    HISTORY:  See H&P in chart  HOSPITAL COURSE:  Kristin Farley is a 72 y.o. admitted on 11/02/2017 and found to have a diagnosis of PRIMARY LOCALIZED OSTEOARTHRITIS LEFT KNEE.  After appropriate laboratory studies were obtained  they were taken to the operating room on 11/02/2017 and underwent  Procedure(s): LEFT TOTAL KNEE ARTHROPLASTY  Left.   They were given perioperative antibiotics:  Anti-infectives (From admission, onward)   Start     Dose/Rate Route Frequency Ordered Stop   11/02/17 1700  ceFAZolin (ANCEF) IVPB 2g/100 mL premix     2 g 200 mL/hr over 30 Minutes Intravenous Every 6 hours 11/02/17 1428 11/02/17 2245   11/02/17 0930  ceFAZolin (ANCEF) IVPB 2g/100 mL  premix     2 g 200 mL/hr over 30 Minutes Intravenous To ShortStay Surgical 11/01/17 1410 11/02/17 1045    .  Tolerated the procedure well.  Placed with a foley intraoperatively.    POD #1, allowed out of bed to a chair.  PT for ambulation and exercise program.  Foley D/C'd in morning.  IV saline locked.  O2 discontionued.  POD #2, continued PT and ambulation.    The remainder of the hospital course was dedicated to ambulation and strengthening.   The patient was discharged on 3 Days Post-Op in  Stable condition.  Blood products given:none  DIAGNOSTIC STUDIES: Recent vital signs:  Patient Vitals for the past 24 hrs:  BP Temp Temp src Pulse Resp SpO2  11/05/17 0627 (!) 152/86 99 F (37.2 C) Oral 81 16 100 %  11/04/17 1959 139/87 100.3 F (37.9 C) Oral (!) 110 16 97 %  11/04/17 1636 - 99.6 F (37.6 C) Oral - - -       Recent laboratory studies: Recent Labs    11/03/17 0412 11/04/17 0313 11/05/17 0508  WBC 8.1 7.8 7.8  HGB 10.5* 10.3* 9.8*  HCT 33.6* 33.3* 31.7*  PLT 233 210 200   Recent Labs    11/03/17 0412 11/04/17 0313  NA 138 138  K 3.8  3.8  CL 103 106  CO2 26 25  BUN 6* <5*  CREATININE 0.88 0.74  GLUCOSE 192* 176*  CALCIUM 9.2 9.1   Lab Results  Component Value Date   INR 0.93 10/22/2017     Recent Radiographic Studies :  Dg Chest 2 View  Result Date: 10/22/2017 CLINICAL DATA:  Primary osteoarthritis left knee.  Preop study EXAM: CHEST - 2 VIEW COMPARISON:  07/01/2006 FINDINGS: The heart size and mediastinal contours are within normal limits. Both lungs are clear. The visualized skeletal structures are unremarkable. Mild scarring in the lung bases. IMPRESSION: No active cardiopulmonary disease. Electronically Signed   By: Marlan Palau M.D.   On: 10/22/2017 14:10    DISCHARGE INSTRUCTIONS:   DISCHARGE MEDICATIONS:   Allergies as of 11/05/2017      Reactions   Sulfur Nausea And Vomiting      Medication List    STOP taking these medications    aspirin-acetaminophen-caffeine 250-250-65 MG tablet Commonly known as:  EXCEDRIN MIGRAINE   diclofenac 75 MG EC tablet Commonly known as:  VOLTAREN   HYDROcodone-acetaminophen 5-325 MG tablet Commonly known as:  NORCO/VICODIN     TAKE these medications   acetaminophen 325 MG tablet Commonly known as:  TYLENOL Take 2 tablets (650 mg total) by mouth every 6 (six) hours as needed.   aspirin 81 MG tablet Take 1 tablet (81 mg total) by mouth 2 (two) times daily for 15 days. Then may resume normal once daily dose What changed:    when to take this  additional instructions   atenolol 25 MG tablet Commonly known as:  TENORMIN Take 25 mg by mouth at bedtime.   atorvastatin 20 MG tablet Commonly known as:  LIPITOR Take 20 mg by mouth every evening.   B-12 2500 MCG Tabs Take 2,500 mcg by mouth daily.   benazepril 5 MG tablet Commonly known as:  LOTENSIN Take 5 mg by mouth daily.   BL ST JOHNS WORT PO Take 300 mg by mouth every evening.   l-methylfolate-B6-B12 3-35-2 MG Tabs tablet Commonly known as:  METANX Take 1 tablet by mouth daily.   Melatonin 5 MG Tabs Take 5 mg by mouth at bedtime.   metFORMIN 500 MG tablet Commonly known as:  GLUCOPHAGE Take 500-1,000 mg by mouth See admin instructions. Take 1000 mg in the morning and 500 mg in the evening   MYRBETRIQ 50 MG Tb24 tablet Generic drug:  mirabegron ER Take 50 mg by mouth daily.   omeprazole 20 MG capsule Commonly known as:  PRILOSEC Take 20 mg by mouth 2 (two) times daily.   oxyCODONE 5 MG immediate release tablet Commonly known as:  Oxy IR/ROXICODONE Take 1 tab po q4-6hrs prn pain, may need 1-2 for the first couple weeks   Vitamin D 2000 units tablet Take 2,000 Units by mouth daily.       FOLLOW UP VISIT:    Contact information for follow-up providers    Frederico Hamman, MD. Schedule an appointment as soon as possible for a visit in 2 weeks.   Specialty:  Orthopedic Surgery Contact  information: [address] [place] [address] [phone]            Contact information for after-discharge care    Destination    [facility] Preferred SNF .   Service:  Skilled Nursing Contact information: [address] [place] [address] [phone]  DISPOSITION:   Skilled Nursing Facility/Rehab  CONDITION:  Stable   Margart SicklesJoshua Rebecka Oelkers, PA-C  11/05/2017 12:11 PM

## 2017-11-06 ENCOUNTER — Encounter (HOSPITAL_COMMUNITY): Payer: Self-pay | Admitting: Orthopedic Surgery

## 2018-06-17 ENCOUNTER — Encounter: Payer: Self-pay | Admitting: Gastroenterology

## 2018-06-25 ENCOUNTER — Other Ambulatory Visit: Payer: Self-pay

## 2018-06-25 ENCOUNTER — Ambulatory Visit: Payer: Medicare Other | Admitting: Gastroenterology

## 2018-06-25 ENCOUNTER — Encounter: Payer: Self-pay | Admitting: Gastroenterology

## 2018-06-25 VITALS — BP 117/75 | HR 76 | Temp 97.3°F | Ht 64.0 in | Wt 188.0 lb

## 2018-06-25 DIAGNOSIS — R109 Unspecified abdominal pain: Secondary | ICD-10-CM | POA: Diagnosis not present

## 2018-06-25 DIAGNOSIS — R143 Flatulence: Secondary | ICD-10-CM

## 2018-06-25 DIAGNOSIS — K219 Gastro-esophageal reflux disease without esophagitis: Secondary | ICD-10-CM

## 2018-06-25 DIAGNOSIS — R110 Nausea: Secondary | ICD-10-CM | POA: Diagnosis not present

## 2018-06-25 MED ORDER — ESOMEPRAZOLE MAGNESIUM 40 MG PO CPDR: 40.0000 mg | DELAYED_RELEASE_CAPSULE | Freq: Every day | ORAL | 1 refills | Status: DC

## 2018-06-25 NOTE — Patient Instructions (Signed)
1. Upper endoscopy as scheduled. See separate instructions.  2. Please have your labs done.  3. Stop omeprazole and start generic nexium 40mg  once daily before breakfast. RX sent to [facility].

## 2018-06-25 NOTE — Progress Notes (Addendum)
Primary Care Physician:  [name], MD  Primary Gastroenterologist:  Jonette Eva, MD  REVIEWED-NO ADDITIONAL RECOMMENDATIONS.  Chief Complaint  Patient presents with  . Nausea    when she eats  . Diarrhea    anything she eats after breakfast  . Gas    HPI:  Kristin Farley is a 73 y.o. female here at the request of Dr. Sherryll Burger for further evaluation of diarrhea, nausea, gas.  She was last seen in 2016.  She failed to follow-up.  Has a history of incomplete colonoscopy by Dr. Romie Levee in December 2013.  Scope could not be passed beyond the transverse colon due to significant looping and tortuous colon.  Follow-up barium enema was normal.  Patient states she was seen a couple of weeks ago by her PCP.  At the time she was having upper abdominal pain and some nausea.  She also mention some mild diarrhea but states that is not her primary concern right now.  She feels like her food is not digesting.  She has discomfort in the upper abdomen, like the food is just sitting there.  Her stomach then begins to boil.  She develops flatulence.  Sometimes is associated with diarrhea.  "I live off of Mylanta".  States she was on omeprazole twice daily for years after having an EGD showing acid reflux.  Recently Dr. Sherryll Burger changed her to pantoprazole in the morning and Pepcid in the evening.  She states this caused her to have diarrhea and nausea.  She is back on omeprazole 20 mg twice daily.  Recently given samples of Xifaxan 550 mg twice daily for 2 weeks for her diarrhea and digestive concerns.No heartburn. No dysphagia. No vomiting. Some gas pain, when gas passes the pain goes away.   States she typically tolerates her breakfast which usually consist of toast, breakfast meat, egg but sometimes has a biscuit instead of toast.  Later in the day she usually has meat, vegetables, bread and then her stomach starts feeling unsettled, rumbly, nauseated and feels like the food is not digesting.    Takes a  stool softener every day since her bladder surgery she had remotely.  She has diarrhea about once per week, usually consisting of a couple of loose stools.  She may take Imodium for that.  Denies constipation.  No melena or rectal bleeding.  She takes diclofenac twice daily.  Current Outpatient Medications  Medication Sig Dispense Refill  . acetaminophen (TYLENOL) 325 MG tablet Take 2 tablets (650 mg total) by mouth every 6 (six) hours as needed. 60 tablet 0  . Alum & Mag Hydroxide-Simeth (MYLANTA PO) Take by mouth. 1-2 times per day    . Ascorbic Acid (VITAMIN C PO) Take by mouth daily.    Marland Kitchen aspirin EC 81 MG tablet Take 81 mg by mouth daily.    Marland Kitchen atenolol (TENORMIN) 25 MG tablet Take 25 mg by mouth at bedtime.     Marland Kitchen atorvastatin (LIPITOR) 20 MG tablet Take 20 mg by mouth every evening.     . B Complex Vitamins (B COMPLEX PO) Take by mouth daily.    . benazepril (LOTENSIN) 5 MG tablet Take 5 mg by mouth daily.     . Cholecalciferol (VITAMIN D) 2000 units tablet Take 2,000 Units by mouth daily.    . diclofenac (VOLTAREN) 75 MG EC tablet Take 75 mg by mouth 2 (two) times daily.    Tery Sanfilippo Calcium (STOOL SOFTENER PO) Take by mouth at bedtime.    Marland Kitchen  l-methylfolate-B6-B12 (METANX) 3-35-2 MG TABS tablet Take 1 tablet by mouth daily.    . metFORMIN (GLUCOPHAGE) 500 MG tablet Take 500 mg by mouth 2 (two) times daily with a meal.     . omeprazole (PRILOSEC) 20 MG capsule Take 20 mg by mouth 2 (two) times daily.     . rifaximin (XIFAXAN) 550 MG TABS tablet Take 550 mg by mouth 2 (two) times daily.    . TURMERIC PO Take by mouth daily.     No current facility-administered medications for this visit.     Allergies as of 06/25/2018 - Review Complete 06/25/2018  Allergen Reaction Noted  . Sulfur Nausea And Vomiting 10/17/2017    Past Medical History:  Diagnosis Date  . Anxiety   . Arthritis    "shoulders" (11/02/2017)  . Frequency of urination   . GERD (gastroesophageal reflux disease)   .  Heart murmur   . High cholesterol   . Migraines    "q 2-3 months" (11/02/2017)  . MVP (mitral valve prolapse)   . OSA (obstructive sleep apnea)    "need new mask" (11/02/2017)  . Primary localized osteoarthritis of knee    Left  . SOB (shortness of breath) on exertion   . Stress incontinence   . Type II diabetes mellitus (HCC)   . 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 Urgency of urination     Past Surgical History:  Procedure Laterality Date  . CATARACT EXTRACTION W/ INTRAOCULAR LENS  IMPLANT, BILATERAL    . COLONOSCOPY  03/22/2012    Romie Levee, MD-NL BUT INCOMPLETE  . DILATION AND CURETTAGE OF UTERUS    . JOINT REPLACEMENT    . KNEE ARTHROSCOPY Left 01/2017  . PUBOVAGINAL SLING  08/25/2011   Procedure: Leonides Grills;  Surgeon: Kathi Ludwig, MD;  Location: [facility];  Service: Urology;  Laterality: N/A;  BOSTON SCIENTIFIC MESH UPHOLD LITE ANTERIOR VAULT REPAIR   . TONSILLECTOMY AND ADENOIDECTOMY  AGE 14  . TOTAL ABDOMINAL HYSTERECTOMY  1990   w/BSO  . TOTAL KNEE ARTHROPLASTY Left 11/02/2017  . TOTAL KNEE ARTHROPLASTY Left 11/02/2017   Procedure: LEFT TOTAL KNEE ARTHROPLASTY;  Surgeon: Frederico Hamman, MD;  Location: [facility] OR;  Service: Orthopedics;  Laterality: Left;    Family History  Problem Relation Age of Onset  . Cancer Mother        lung  . Alcohol abuse Father   . Colon cancer Neg Hx   . Colon polyps Neg Hx     Social History   Socioeconomic History  . Marital status: Divorced    Spouse name: Not on file  . Number of children: Not on file  . Years of education: Not on file  . Highest education level: Not on file  Occupational History  . Not on file  Social Needs  . Financial resource strain: Not on file  . Food insecurity:    Worry: Not on file    Inability: Not on file  . Transportation needs:    Medical: Not on file    Non-medical: Not on file  Tobacco Use  . Smoking status: Never Smoker  . Smokeless tobacco:  Never Used  Substance and Sexual Activity  . Alcohol use: No  . Drug use: No  . Sexual activity: Not Currently  Lifestyle  . Physical activity:    Days per week: Not on file    Minutes per session: Not on file  . Stress: Not on file  Relationships  . Social connections:    Talks on phone: Not on file    Gets together: Not on file    Attends religious service: Not on file    Active member of club or organization: Not on file    Attends meetings of clubs or organizations: Not on file    Relationship status: Not on file  . Intimate partner violence:    Fear of current or ex partner: Not on file    Emotionally abused: Not on file    Physically abused: Not on file    Forced sexual activity: Not on file  Other Topics Concern  . Not on file  Social History Narrative  . Not on file      ROS:  General: Negative for anorexia, weight loss, fever, chills, fatigue, weakness. Eyes: Negative for vision changes.  ENT: Negative for hoarseness, difficulty swallowing , nasal congestion. CV: Negative for chest pain, angina, palpitations, dyspnea on exertion, peripheral edema.  Respiratory: Negative for dyspnea at rest, dyspnea on exertion, cough, sputum, wheezing.  GI: See history of present illness. GU:  Negative for dysuria, hematuria, urinary incontinence, urinary frequency, nocturnal urination.  MS: Negative for joint pain, low back pain.  Derm: Negative for rash or itching.  Neuro: Negative for weakness, abnormal sensation, seizure, frequent headaches, memory loss, confusion.  Psych: Negative for anxiety, depression, suicidal ideation, hallucinations.  Endo: Negative for unusual weight change.  Heme: Negative for bruising or bleeding. Allergy: Negative for rash or hives.    Physical Examination:  BP 117/75   Pulse 76   Temp (!) 97.3 F (36.3 C) (Oral)   Ht 5\' 4"  (1.626 m)   Wt 188 lb (85.3 kg)   BMI 32.27 kg/m    General: Well-nourished, well-developed in no acute  distress.  Head: Normocephalic, atraumatic.   Eyes: Conjunctiva pink, no icterus. Mouth: Oropharyngeal mucosa moist and pink , no lesions erythema or exudate. Neck: Supple without thyromegaly, masses, or lymphadenopathy.  Lungs: Clear to auscultation bilaterally.  Heart: Regular rate and rhythm, no murmurs rubs or gallops.  Abdomen: Bowel sounds are normal, nontender, nondistended, no hepatosplenomegaly or masses, no abdominal bruits or    hernia , no rebound or guarding.   Rectal: Not performed Extremities: No lower extremity edema. No clubbing or deformities.  Neuro: Alert and oriented x 4 , grossly normal neurologically.  Skin: Warm and dry, no rash or jaundice.   Psych: Alert and cooperative, normal mood and affect.

## 2018-06-26 LAB — COMPREHENSIVE METABOLIC PANEL
AG RATIO: 1.8 (calc) (ref 1.0–2.5)
ALT: 14 U/L (ref 6–29)
AST: 12 U/L (ref 10–35)
Albumin: 4.3 g/dL (ref 3.6–5.1)
Alkaline phosphatase (APISO): 46 U/L (ref 37–153)
BUN: 11 mg/dL (ref 7–25)
CO2: 25 mmol/L (ref 20–32)
Calcium: 9.8 mg/dL (ref 8.6–10.4)
Chloride: 104 mmol/L (ref 98–110)
Creat: 0.73 mg/dL (ref 0.60–0.93)
GLUCOSE: 94 mg/dL (ref 65–139)
Globulin: 2.4 g/dL (calc) (ref 1.9–3.7)
Potassium: 4.4 mmol/L (ref 3.5–5.3)
SODIUM: 140 mmol/L (ref 135–146)
Total Bilirubin: 0.2 mg/dL (ref 0.2–1.2)
Total Protein: 6.7 g/dL (ref 6.1–8.1)

## 2018-06-26 LAB — CBC WITH DIFFERENTIAL/PLATELET
Absolute Monocytes: 495 cells/uL (ref 200–950)
Basophils Absolute: 31 cells/uL (ref 0–200)
Basophils Relative: 0.6 %
EOS PCT: 3.4 %
Eosinophils Absolute: 173 cells/uL (ref 15–500)
HCT: 35.5 % (ref 35.0–45.0)
HEMOGLOBIN: 11.1 g/dL — AB (ref 11.7–15.5)
LYMPHS ABS: 1489 {cells}/uL (ref 850–3900)
MCH: 27.7 pg (ref 27.0–33.0)
MCHC: 31.3 g/dL — ABNORMAL LOW (ref 32.0–36.0)
MCV: 88.5 fL (ref 80.0–100.0)
MONOS PCT: 9.7 %
MPV: 10.5 fL (ref 7.5–12.5)
NEUTROS ABS: 2912 {cells}/uL (ref 1500–7800)
Neutrophils Relative %: 57.1 %
Platelets: 264 10*3/uL (ref 140–400)
RBC: 4.01 10*6/uL (ref 3.80–5.10)
RDW: 13.5 % (ref 11.0–15.0)
Total Lymphocyte: 29.2 %
WBC: 5.1 10*3/uL (ref 3.8–10.8)

## 2018-06-26 LAB — TISSUE TRANSGLUTAMINASE, IGA: (tTG) Ab, IgA: 1 U/mL

## 2018-06-26 LAB — LIPASE: LIPASE: 55 U/L (ref 7–60)

## 2018-06-26 LAB — IGA: IMMUNOGLOBULIN A: 119 mg/dL (ref 70–320)

## 2018-06-27 ENCOUNTER — Telehealth: Payer: Self-pay

## 2018-06-27 NOTE — Telephone Encounter (Signed)
Called pt, EGD for 07/01/18 moved to 06/28/18 at 10:15am. Pt will arrive at 9:15am. Verbal instructions given for clear liquids only (no red) today 6:00pm till midnight. NPO after midnight. Pt verbalized understanding. Endo scheduler informed.

## 2018-06-27 NOTE — Assessment & Plan Note (Signed)
Pleasant 73 year old female with chronic acid reflux disease, has been on omeprazole 20 mg twice daily for years.  Typical heartburn well controlled.  Over the last couple months has had increasing difficulty with postprandial upper abdominal discomfort, bloating, feels like her food is not digesting well.  Denies any dysphagia.  She has had some nausea.  She is on diclofenac twice daily.  Cannot exclude dyspepsia related to gastritis, peptic ulcer disease, less likely malignancy.  Offered her endoscopy for further evaluation of her symptoms.  I have discussed the risks, alternatives, benefits with regards to but not limited to the risk of reaction to medication, bleeding, infection, perforation and the patient is agreeable to proceed. Written consent to be obtained.  We will also check labs, screen for celiac.  Switch her from omeprazole to Nexium 40 mg daily.

## 2018-06-28 ENCOUNTER — Encounter (HOSPITAL_COMMUNITY): Payer: Self-pay | Admitting: *Deleted

## 2018-06-28 ENCOUNTER — Other Ambulatory Visit: Payer: Self-pay

## 2018-06-28 ENCOUNTER — Ambulatory Visit (HOSPITAL_COMMUNITY)
Admission: RE | Admit: 2018-06-28 | Discharge: 2018-06-28 | Disposition: A | Discharge status: Home / Self Care | Payer: Medicare Other | Attending: Gastroenterology | Admitting: Gastroenterology

## 2018-06-28 ENCOUNTER — Encounter (HOSPITAL_COMMUNITY)
Admission: RE | Disposition: A | Discharge status: Home / Self Care | Payer: Self-pay | Source: Home / Self Care | Attending: Gastroenterology

## 2018-06-28 DIAGNOSIS — E7800 Pure hypercholesterolemia, unspecified: Secondary | ICD-10-CM | POA: Diagnosis not present

## 2018-06-28 DIAGNOSIS — Z7984 Long term (current) use of oral hypoglycemic drugs: Secondary | ICD-10-CM | POA: Insufficient documentation

## 2018-06-28 DIAGNOSIS — R110 Nausea: Secondary | ICD-10-CM | POA: Diagnosis not present

## 2018-06-28 DIAGNOSIS — K2950 Unspecified chronic gastritis without bleeding: Secondary | ICD-10-CM | POA: Insufficient documentation

## 2018-06-28 DIAGNOSIS — Z7982 Long term (current) use of aspirin: Secondary | ICD-10-CM | POA: Diagnosis not present

## 2018-06-28 DIAGNOSIS — Z79899 Other long term (current) drug therapy: Secondary | ICD-10-CM | POA: Insufficient documentation

## 2018-06-28 DIAGNOSIS — Z791 Long term (current) use of non-steroidal anti-inflammatories (NSAID): Secondary | ICD-10-CM | POA: Diagnosis not present

## 2018-06-28 DIAGNOSIS — Z96652 Presence of left artificial knee joint: Secondary | ICD-10-CM | POA: Diagnosis not present

## 2018-06-28 DIAGNOSIS — K317 Polyp of stomach and duodenum: Secondary | ICD-10-CM | POA: Insufficient documentation

## 2018-06-28 DIAGNOSIS — R1013 Epigastric pain: Secondary | ICD-10-CM | POA: Insufficient documentation

## 2018-06-28 DIAGNOSIS — R109 Unspecified abdominal pain: Secondary | ICD-10-CM

## 2018-06-28 DIAGNOSIS — E119 Type 2 diabetes mellitus without complications: Secondary | ICD-10-CM | POA: Insufficient documentation

## 2018-06-28 DIAGNOSIS — G4733 Obstructive sleep apnea (adult) (pediatric): Secondary | ICD-10-CM | POA: Diagnosis not present

## 2018-06-28 DIAGNOSIS — R1033 Periumbilical pain: Secondary | ICD-10-CM | POA: Diagnosis present

## 2018-06-28 DIAGNOSIS — R143 Flatulence: Secondary | ICD-10-CM

## 2018-06-28 DIAGNOSIS — K222 Esophageal obstruction: Secondary | ICD-10-CM

## 2018-06-28 DIAGNOSIS — Z882 Allergy status to sulfonamides status: Secondary | ICD-10-CM | POA: Diagnosis not present

## 2018-06-28 DIAGNOSIS — F419 Anxiety disorder, unspecified: Secondary | ICD-10-CM | POA: Insufficient documentation

## 2018-06-28 DIAGNOSIS — K2970 Gastritis, unspecified, without bleeding: Secondary | ICD-10-CM | POA: Diagnosis not present

## 2018-06-28 DIAGNOSIS — K219 Gastro-esophageal reflux disease without esophagitis: Secondary | ICD-10-CM

## 2018-06-28 DIAGNOSIS — M19012 Primary osteoarthritis, left shoulder: Secondary | ICD-10-CM | POA: Diagnosis not present

## 2018-06-28 DIAGNOSIS — M19011 Primary osteoarthritis, right shoulder: Secondary | ICD-10-CM | POA: Insufficient documentation

## 2018-06-28 HISTORY — PX: ESOPHAGOGASTRODUODENOSCOPY: SHX5428

## 2018-06-28 HISTORY — PX: BIOPSY: SHX5522

## 2018-06-28 LAB — GLUCOSE, CAPILLARY: Glucose-Capillary: 101 mg/dL — ABNORMAL HIGH (ref 70–99)

## 2018-06-28 SURGERY — EGD (ESOPHAGOGASTRODUODENOSCOPY)
Anesthesia: Moderate Sedation

## 2018-06-28 MED ORDER — MIDAZOLAM HCL 5 MG/5ML IJ SOLN
INTRAMUSCULAR | Status: DC | PRN
  Administered 2018-06-28 (×2): 2 mg via INTRAVENOUS

## 2018-06-28 MED ORDER — LIDOCAINE VISCOUS HCL 2 % MT SOLN
OROMUCOSAL | Status: AC
  Filled 2018-06-28: qty 15

## 2018-06-28 MED ORDER — ONDANSETRON HCL 4 MG/2ML IJ SOLN
INTRAMUSCULAR | Status: DC | PRN
  Administered 2018-06-28: 4 mg via INTRAVENOUS

## 2018-06-28 MED ORDER — MEPERIDINE HCL 100 MG/ML IJ SOLN
INTRAMUSCULAR | Status: DC | PRN
  Administered 2018-06-28 (×2): 25 mg

## 2018-06-28 MED ORDER — MEPERIDINE HCL 100 MG/ML IJ SOLN
INTRAMUSCULAR | Status: AC
  Filled 2018-06-28: qty 1

## 2018-06-28 MED ORDER — OMEPRAZOLE 20 MG PO CPDR: DELAYED_RELEASE_CAPSULE | ORAL | 11 refills | Status: DC

## 2018-06-28 MED ORDER — ONDANSETRON HCL 4 MG/2ML IJ SOLN
INTRAMUSCULAR | Status: AC
  Filled 2018-06-28: qty 2

## 2018-06-28 MED ORDER — MIDAZOLAM HCL 5 MG/5ML IJ SOLN
INTRAMUSCULAR | Status: AC
  Filled 2018-06-28: qty 5

## 2018-06-28 MED ORDER — SODIUM CHLORIDE 0.9 % IV SOLN
INTRAVENOUS | Status: DC
  Administered 2018-06-28: 10:00:00 via INTRAVENOUS

## 2018-06-28 NOTE — H&P (Addendum)
Primary Care Physician:  [name], MD Primary Gastroenterologist:  Dr. Darrick Penna  Pre-Procedure History & Physical: HPI:  Kristin Farley is a 73 y.o. female here for dyspepsia.  Past Medical History:  Diagnosis Date  . Anxiety   . Arthritis    "shoulders" (11/02/2017)  . Frequency of urination   . GERD (gastroesophageal reflux disease)   . Heart murmur   . High cholesterol   . Migraines    "q 2-3 months" (11/02/2017)  . MVP (mitral valve prolapse)   . OSA (obstructive sleep apnea)    "need new mask" (11/02/2017)  . Primary localized osteoarthritis of knee    Left  . SOB (shortness of breath) on exertion   . Stress incontinence   . Type II diabetes mellitus (HCC)   . 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 Urgency of urination     Past Surgical History:  Procedure Laterality Date  . CATARACT EXTRACTION W/ INTRAOCULAR LENS  IMPLANT, BILATERAL    . COLONOSCOPY  03/22/2012    Romie Levee, MD-NL BUT INCOMPLETE  . DILATION AND CURETTAGE OF UTERUS    . JOINT REPLACEMENT    . KNEE ARTHROSCOPY Left 01/2017  . PUBOVAGINAL SLING  08/25/2011   Procedure: Leonides Grills;  Surgeon: Kathi Ludwig, MD;  Location: [facility];  Service: Urology;  Laterality: N/A;  BOSTON SCIENTIFIC MESH UPHOLD LITE ANTERIOR VAULT REPAIR   . TONSILLECTOMY AND ADENOIDECTOMY  AGE 67  . TOTAL ABDOMINAL HYSTERECTOMY  1990   w/BSO  . TOTAL KNEE ARTHROPLASTY Left 11/02/2017  . TOTAL KNEE ARTHROPLASTY Left 11/02/2017   Procedure: LEFT TOTAL KNEE ARTHROPLASTY;  Surgeon: Frederico Hamman, MD;  Location: [facility] OR;  Service: Orthopedics;  Laterality: Left;    Prior to Admission medications   Medication Sig Start Date End Date Taking? Authorizing Provider  acetaminophen (TYLENOL) 650 MG CR tablet Take 650 mg by mouth every 8 (eight) hours as needed for pain.   Yes [provider]  Alum & Mag Hydroxide-Simeth (MYLANTA PO) Take 15-30 mLs by mouth 2 (two) times daily as  needed (upset stomach).    Yes [provider]  Ascorbic Acid (VITAMIN C PO) Take 1 tablet by mouth daily.    Yes [provider]  aspirin EC 81 MG tablet Take 81 mg by mouth daily.   Yes [provider]  atenolol (TENORMIN) 25 MG tablet Take 25 mg by mouth at bedtime.    Yes [provider]  atorvastatin (LIPITOR) 20 MG tablet Take 20 mg by mouth every evening.  01/18/12  Yes [provider]  B Complex Vitamins (B COMPLEX PO) Take 1 capsule by mouth daily.    Yes [provider]  benazepril (LOTENSIN) 5 MG tablet Take 5 mg by mouth daily.  06/26/13  Yes [provider]  Cholecalciferol (VITAMIN D) 2000 units tablet Take 2,000 Units by mouth daily.   Yes [provider]  diclofenac (VOLTAREN) 75 MG EC tablet Take 75 mg by mouth 2 (two) times daily.   Yes [provider]  docusate sodium (COLACE) 100 MG capsule Take 100 mg by mouth at bedtime.   Yes [provider]  l-methylfolate-B6-B12 (METANX) 3-35-2 MG TABS tablet Take 1 tablet by mouth daily.   Yes [provider]  metFORMIN (GLUCOPHAGE) 500 MG tablet Take 500 mg by mouth 2 (two) times daily with a meal.    Yes [provider]  omeprazole (PRILOSEC) 20 MG capsule Take 20  mg by mouth 2 (two) times daily.   Yes [provider]  TURMERIC PO Take 1 tablet by mouth daily.    Yes [provider]  esomeprazole (NEXIUM) 40 MG capsule Take 1 capsule (40 mg total) by mouth daily before breakfast. 06/25/18   Tiffany Kocher, PA-C    Allergies as of 06/25/2018 - Review Complete 06/25/2018  Allergen Reaction Noted  . Sulfur Nausea And Vomiting 10/17/2017    Family History  Problem Relation Age of Onset  . Cancer Mother        lung  . Alcohol abuse Father   . Colon cancer Neg Hx   . Colon polyps Neg Hx     Social History   Socioeconomic History  . Marital status: Divorced    Spouse name: Not on file  . Number of  children: Not on file  . Years of education: Not on file  . Highest education level: Not on file  Occupational History  . Not on file  Social Needs  . Financial resource strain: Not on file  . Food insecurity:    Worry: Not on file    Inability: Not on file  . Transportation needs:    Medical: Not on file    Non-medical: Not on file  Tobacco Use  . Smoking status: Never Smoker  . Smokeless tobacco: Never Used  Substance and Sexual Activity  . Alcohol use: No  . Drug use: No  . Sexual activity: Not Currently  Lifestyle  . Physical activity:    Days per week: Not on file    Minutes per session: Not on file  . Stress: Not on file  Relationships  . Social connections:    Talks on phone: Not on file    Gets together: Not on file    Attends religious service: Not on file    Active member of club or organization: Not on file    Attends meetings of clubs or organizations: Not on file    Relationship status: Not on file  . Intimate partner violence:    Fear of current or ex partner: Not on file    Emotionally abused: Not on file    Physically abused: Not on file    Forced sexual activity: Not on file  Other Topics Concern  . Not on file  Social History Narrative  . Not on file    Review of Systems: See HPI, otherwise negative ROS   Physical Exam: BP 114/70   Pulse 69   Temp 98.1 F (36.7 C) (Oral)   Resp 12   Ht 5\' 4"  (1.626 m)   Wt 85.3 kg   SpO2 100%   BMI 32.27 kg/m  General:   Alert,  pleasant and cooperative in NAD Head:  Normocephalic and atraumatic. Neck:  Supple; Lungs:  Clear throughout to auscultation.    Heart:  Regular rate and rhythm. Abdomen:  Soft, nontender and nondistended. Normal bowel sounds, without guarding, and without rebound.   Neurologic:  Alert and  oriented x4;  grossly normal neurologically.  Impression/Plan:     DYSPEPSIA  PLAN:  EGD/?dilation TODAY. DISCUSSED PROCEDURE, BENEFITS, & RISKS: < 1% chance of medication  reaction, bleeding, perforation, or ASPIRATION.

## 2018-06-28 NOTE — Op Note (Signed)
[facility] Patient Name: Kristin Farley Procedure Date: 06/28/2018 10:23 AM MRN: [id number] Date of Birth: 01-03-1946 Attending MD: Jonette EvaSandi Juliahna Wiswell MD, MD CSN: [id number] Age: [id number] Admit Type: Outpatient Procedure:                Upper GI endoscopy WITH COLD FORCEPS BIOPSY Indications:              Periumbilical abdominal pain, Dyspepsia, Nausea Providers:                Jonette EvaSandi Alonda Weaber MD, MD, Jannett CelestineAnitra Bell, RN, Lizabeth LeydenLoreen Bare,                            RN, Burke Keelsrisann Tilley, Technician, Pandora LeiterNeville David,                            Technician Referring MD:             Ignatius Speckinghruv B. Vyas Medicines:                Ondansetron 4 mg IV, Meperidine 50 mg IV, Midazolam                            4 mg IV Complications:            No immediate complications. Estimated Blood Loss:     Estimated blood loss was minimal. Procedure:                Pre-Anesthesia Assessment:                           - Prior to the procedure, a History and Physical                            was performed, and patient medications and                            allergies were reviewed. The patient's tolerance of                            previous anesthesia was also reviewed. The risks                            and benefits of the procedure and the sedation                            options and risks were discussed with the patient.                            All questions were answered, and informed consent                            was obtained. Prior Anticoagulants: The patient has                            taken aspirin, last dose was 1 day prior to  procedure. ASA Grade Assessment: II - A patient                            with mild systemic disease. After reviewing the                            risks and benefits, the patient was deemed in                            satisfactory condition to undergo the procedure.                            After obtaining informed consent, the endoscope was                             passed under direct vision. Throughout the                            procedure, the patient's blood pressure, pulse, and                            oxygen saturations were monitored continuously. The                            [id number]-[id number] ([id number]) scope was introduced through the                            mouth, and advanced to the second part of duodenum.                            The upper GI endoscopy was accomplished without                            difficulty. The patient tolerated the procedure                            well. Scope In: 11:12:17 AM Scope Out: 11:22:02 AM Total Procedure Duration: 0 hours 9 minutes 45 seconds  Findings:      One benign-appearing, intrinsic moderate (circumferential scarring or       stenosis; an endoscope may pass) stenosis was found. This stenosis       measured 1.6 cm (inner diameter). The stenosis was traversed.      Patchy mild inflammation characterized by congestion (edema) and       erythema was found on the greater curvature of the stomach and in the       gastric antrum. Biopsies(2: BODY, 1 :INCISURA, 2: ANTRUM) were taken       with a cold forceps for Helicobacter pylori testing.      Multiple diminutive sessile polyps with no stigmata of recent bleeding       were found in the gastric fundus and in the gastric body. The polyp was       removed with a cold biopsy forceps. Resection and retrieval were       complete.      The duodenal bulb  was normal. Biopsies(2) for histology were taken with       a cold forceps for evaluation of celiac disease OR EOSINOPHILIC       DUODENITIS.      The second portion of the duodenum was normal. Biopsies(4) for histology       were taken with a cold forceps for evaluation of celiac disease OR       EOSINOPHILIC DUODENITIS. 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 .                            - Multiple gastric polyps.                           - NO OBVIOUS SOURCE FOR DYSPEPSIA. Moderate Sedation:      Moderate (conscious) sedation was administered by the endoscopy nurse       and supervised by the endoscopist. The following parameters were       monitored: oxygen saturation, heart rate, blood pressure, and response       to care. Total physician intraservice time was 24 minutes. Recommendation:           - Patient has a contact number available for                            emergencies. The signs and symptoms of potential                            delayed complications were discussed with the                            patient. Return to normal activities tomorrow.                            Written discharge instructions were provided to the                            patient.                           - Diabetic (ADA) diet.                           - Continue present medications.                           - Return to my office in 4 months.                           - Await pathology results. IF NO SPRUE OR H PYLORI,                            PT WILL NEED ABX FOR SIBO. Procedure Code(s):        ---  Professional ---                           [phone], Esophagogastroduodenoscopy, flexible,                            transoral; with biopsy, single or multiple                           99153, Moderate sedation; each additional 15                            minutes intraservice time                           G0500, Moderate sedation services provided by the                            same physician or other qualified health care                            professional performing a gastrointestinal                            endoscopic service that sedation supports,                            requiring the presence of an independent trained                            observer to assist in the monitoring of the                            patient's level of  consciousness and physiological                            status; initial 15 minutes of intra-service time;                            patient age 31 years or older (additional time may                            be reported with 74944, as appropriate) Diagnosis Code(s):        --- Professional ---                           K22.2, Esophageal obstruction                           K29.70, Gastritis, unspecified, without bleeding                           K31.7, Polyp of stomach and duodenum                           R10.33, Periumbilical pain  R10.13, Epigastric pain                           R11.0, Nausea CPT copyright 2018 [employer]. All rights reserved. The codes documented in this report are preliminary and upon coder review may  be revised to meet current compliance requirements. Jonette Eva, MD Jonette Eva MD, MD 06/28/2018 11:43:54 AM This report has been signed electronically. Number of Addenda: 0

## 2018-06-28 NOTE — Progress Notes (Signed)
LMOM to call.

## 2018-06-28 NOTE — Discharge Instructions (Signed)
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  You have mild gastritis. I biopsied your SMALL BOWEL AND stomach.   DRINK WATER TO KEEP YOUR URINE LIGHT YELLOW.  STRICTLY FOLLOW A DIABETIC  DIET.    MEDS THAT CAN CONTRIBUTE TO UPSET STOMACH INCLUDE: GLUCOPHAGE & MYLANTA.  CONTINUE OMEPRAZOLE.  TAKE 30 MINUTES PRIOR TO YOUR MEALS ONCE OR TWICE DAILY.    YOUR BIOPSY RESULTS WILL BE BACK IN 5 BUSINESS DAYS.  FOLLOW UP IN 4 MOS.  You should have a complete colonoscopy before the end of 2020.  UPPER ENDOSCOPY AFTER CARE Read the instructions outlined below and refer to this sheet in the next week. These discharge instructions provide you with general information on caring for yourself after you leave the hospital. While your treatment has been planned according to the most current medical practices available, unavoidable complications occasionally occur. If you have any problems or questions after discharge, call DR. Malachai Schalk, [phone].  ACTIVITY  You may resume your regular activity, but move at a slower pace for the next 24 hours.   Take frequent rest periods for the next 24 hours.   Walking will help get rid of the air and reduce the bloated feeling in your belly (abdomen).   No driving for 24 hours (because of the medicine (anesthesia) used during the test).   You may shower.   Do not sign any important legal documents or operate any machinery for 24 hours (because of the anesthesia used during the test).    NUTRITION  Drink plenty of fluids.   You may resume your normal diet as instructed by your doctor.   Begin with a light meal and progress to your normal diet. Heavy or fried foods are harder to digest and may make you feel sick to your stomach (nauseated).   Avoid alcoholic beverages for 24 hours or as instructed.    MEDICATIONS  You may resume your normal medications.   WHAT YOU CAN EXPECT TODAY  Some feelings of bloating in the abdomen.    Passage of more gas than usual.    IF YOU HAD A BIOPSY TAKEN DURING THE UPPER ENDOSCOPY:  Eat a soft diet IF YOU HAVE NAUSEA, BLOATING, ABDOMINAL PAIN, OR VOMITING.    FINDING OUT THE RESULTS OF YOUR TEST Not all test results are available during your visit. DR. Darrick PennaFIELDS WILL CALL YOU WITHIN 14 DAYS OF YOUR PROCEDUE WITH YOUR RESULTS. Do not assume everything is normal if you have not heard from DR. Wagner Tanzi, CALL HER OFFICE AT [phone].  SEEK IMMEDIATE MEDICAL ATTENTION AND CALL THE OFFICE: [phone] IF:  You have more than a spotting of blood in your stool.   Your belly is swollen (abdominal distention).   You are nauseated or vomiting.   You have a temperature over 101F.   You have abdominal pain or discomfort that is severe or gets worse throughout the day.  Gastritis  Gastritis is an inflammation (the body's way of reacting to injury and/or infection) of the stomach. It is often caused by viral or bacterial (germ) infections. It can also be caused BY ASPIRIN, BC/GOODY POWDER'S, (IBUPROFEN) MOTRIN, OR ALEVE (NAPROXEN), chemicals (including alcohol), SPICY FOODS, and medications. This illness may be associated with generalized malaise (feeling tired, not well), UPPER ABDOMINAL STOMACH cramps, and fever. One common bacterial cause of gastritis is an organism known as H. Pylori. This can be treated with antibiotics.   ESOPHAGEAL STRICTURE  Esophageal strictures can be caused by  stomach acid backing up into the tube that carries food from the mouth down to the stomach (lower esophagus).  TREATMENT There are a number of  medicines used to treat reflux/stricture, including: Antacids.  Proton-pump inhibitors: OMEPRAZOLE PEPCID OR ZANTAC  TAKE YOUR ASPIRIN DAILY.  CONTINUE OMEPRAZOLE TWICE DAILY. TAKE 30 MINUTES TO 1 HOUR PRIOR TO MEALS.  CONTINUE TENUATE. TAKE  HOUR BEFORE MEALS UP TO 3 TIMES A DAY.  DRINK WATER. AVOID KOOL-AID, SWEET TEA, JUICE, & SODA.  FOLLOW UP IN  2 MOS.     Lifestyle and home remedies TO CONTROL REFLUX/HEARTBURN You may eliminate or reduce the frequency of heartburn by making the following lifestyle changes:   Control your weight. Being overweight is a major risk factor for heartburn and GERD. Excess pounds put pressure on your abdomen, pushing up your stomach and causing acid to back up into your esophagus.    Eat smaller meals. 4 TO 6 MEALS A DAY. This reduces pressure on the lower esophageal sphincter, helping to prevent the valve from opening and acid from washing back into your esophagus.    Loosen your belt. Clothes that fit tightly around your waist put pressure on your abdomen and the lower esophageal sphincter.    Eliminate heartburn triggers. Everyone has specific triggers. Common triggers such as fatty or fried foods, spicy food, tomato sauce, carbonated beverages, alcohol, chocolate, mint, garlic, onion, caffeine and nicotine may make heartburn worse.    Avoid stooping or bending. Tying your shoes is OK. Bending over for longer periods to weed your garden isn't, especially soon after eating.    Don't lie down after a meal. Wait at least three to four hours after eating before going to bed, and don't lie down right after eating.    Alternative medicine  Several home remedies exist for treating GERD, but they provide only temporary relief. They include drinking baking soda (sodium bicarbonate) added to water or drinking other fluids such as baking soda mixed with cream of tartar and water.   Although these liquids create temporary relief by neutralizing, washing away or buffering acids, eventually they aggravate the situation by adding gas and fluid to your stomach, increasing pressure and causing more acid reflux. Further, adding more sodium to your diet may increase your blood pressure and add stress to your heart, and excessive bicarbonate ingestion can alter the acid-base balance in your body.   Low-Fat  Diet  BREADS, CEREALS, PASTA, RICE, DRIED PEAS, AND BEANS  These products are high in carbohydrates and most are low in fat. Therefore, they can be increased in the diet as substitutes for fatty foods. They too, however, contain calories and should not be eaten in excess. Cereals can be eaten for snacks as well as for breakfast.   Include foods that contain fiber (fruits, vegetables, whole grains, and legumes). Research shows that fiber may lower blood cholesterol levels, especially the water-soluble fiber found in fruits, vegetables, oat products, and legumes.  FRUITS AND VEGETABLES  It is good to eat fruits and vegetables. Besides being sources of fiber, both are rich in vitamins and some minerals. They help you get the daily allowances of these nutrients. Fruits and vegetables can be used for snacks and desserts.  MEATS  Limit lean meat, chicken, [place], and fish to no more than 6 ounces per day.  Beef, Pork, and Lamb  Use lean cuts of beef, pork, and lamb. Lean cuts include:   Extra-lean ground beef.   Arm roast.  Sirloin tip.   Center-cut ham.   Round steak.   Loin chops.   Rump roast.   Tenderloin.   Trim all fat off the outside of meats before cooking. It is not necessary to severely decrease the intake of red meat, but lean choices should be made. Lean meat is rich in protein and contains a highly absorbable form of iron. Premenopausal women, in particular, should avoid reducing lean red meat because this could increase the risk for low red blood cells (iron-deficiency anemia).  Processed Meats  Processed meats, such as bacon, bologna, salami, sausage, and hot dogs contain large quantities of fat, are not rich in valuable nutrients, and should not be eaten very often.  Organ Meats  The organ meats, such as liver, sweetbreads, kidneys, and brain are very rich in cholesterol. They should be limited.  Chicken and [place]  These are good sources of protein. The fat  of poultry can be reduced by removing the skin and underlying fat layers before cooking. Chicken and [place] can be substituted for lean red meat in the diet. Poultry should not be fried or covered with high-fat sauces.  Fish and Shellfish  Fish is a good source of protein. Shellfish contain cholesterol, but they usually are low in saturated fatty acids. The preparation of fish is important. Like chicken and [place], they should not be fried or covered with high-fat sauces.  EGGS  Egg yolks often are hidden in cooked and processed foods. Egg whites contain no fat or cholesterol. They can be eaten often. Try 1 to 2 egg whites instead of whole eggs in recipes or use egg substitutes that do not contain yolk.  MILK AND DAIRY PRODUCTS  Use skim or 1% milk instead of 2% or whole milk. Decrease whole milk, natural, and processed cheeses. Use nonfat or low-fat (2%) cottage cheese or low-fat cheeses made from vegetable oils. Choose nonfat or low-fat (1 to 2%) yogurt. Experiment with evaporated skim milk in recipes that call for heavy cream. Substitute low-fat yogurt or low-fat cottage cheese for sour cream in dips and salad dressings. Have at least 2 servings of low-fat dairy products, such as 2 glasses of skim (or 1%) milk each day to help get your daily calcium intake.  FATS AND OILS  Reduce the total intake of fats, especially saturated fat. Butterfat, lard, and beef fats are high in saturated fat and cholesterol. These should be avoided as much as possible. Vegetable fats do not contain cholesterol, but certain vegetable fats, such as coconut oil, palm oil, and palm kernel oil are very high in saturated fats. These should be limited. These fats are often used in bakery goods, processed foods, popcorn, oils, and nondairy creamers. Vegetable shortenings and some peanut butters contain hydrogenated oils, which are also saturated fats. Read the labels on these foods and check for saturated vegetable  oils.  Unsaturated vegetable oils and fats do not raise blood cholesterol. However, they should be limited because they are fats and are high in calories. Total fat should still be limited to 30% of your daily caloric intake. Desirable liquid vegetable oils are corn oil, cottonseed oil, olive oil, canola oil, safflower oil, soybean oil, and sunflower oil. Peanut oil is not as good, but small amounts are acceptable. Buy a heart-healthy tub margarine that has no partially hydrogenated oils in the ingredients. Mayonnaise and salad dressings often are made from unsaturated fats, but they should also be limited because of their high calorie and fat content.  Seeds, nuts, peanut butter, olives, and avocados are high in fat, but the fat is mainly the unsaturated type. These foods should be limited mainly to avoid excess calories and fat.  OTHER EATING TIPS  Snacks   Most sweets should be limited as snacks. They tend to be rich in calories and fats, and their caloric content outweighs their nutritional value. Some good choices in snacks are graham crackers, melba toast, soda crackers, bagels (no egg), English muffins, fruits, and vegetables. These snacks are preferable to snack crackers, [place] fries, and chips. Popcorn should be air-popped or cooked in small amounts of liquid vegetable oil.  Desserts  Eat fruit, low-fat yogurt, and fruit ices instead of pastries, cake, and cookies. Sherbet, angel food cake, gelatin dessert, frozen low-fat yogurt, or other frozen products that do not contain saturated fat (pure fruit juice bars, frozen ice pops) are also acceptable.   COOKING METHODS  Choose those methods that use little or no fat. They include:  Poaching.   Braising.   Steaming.   Grilling.   Baking.   Stir-frying.   Broiling.   Microwaving.   Foods can be cooked in a nonstick pan without added fat, or use a nonfat cooking spray in regular cookware. Limit fried foods and avoid frying in  saturated fat. Add moisture to lean meats by using water, broth, cooking wines, and other nonfat or low-fat sauces along with the cooking methods mentioned above.  Soups and stews should be chilled after cooking. The fat that forms on top after a few hours in the refrigerator should be skimmed off. When preparing meals, avoid using excess salt. Salt can contribute to raising blood pressure in some people.  EATING AWAY FROM HOME  Order entres, potatoes, and vegetables without sauces or butter. When meat exceeds the size of a deck of cards (3 to 4 ounces), the rest can be taken home for another meal.  Choose vegetable or fruit salads and ask for low-calorie salad dressings to be served on the side. Use dressings sparingly. Limit high-fat toppings, such as bacon, crumbled eggs, cheese, sunflower seeds, and olives. Ask for heart-healthy tub margarine instead of butter.

## 2018-07-01 ENCOUNTER — Other Ambulatory Visit: Payer: Self-pay

## 2018-07-01 DIAGNOSIS — D582 Other hemoglobinopathies: Secondary | ICD-10-CM

## 2018-07-01 NOTE — Progress Notes (Signed)
Lab order on file for 10/01/2018.

## 2018-07-01 NOTE — Progress Notes (Signed)
PT is aware.

## 2018-07-01 NOTE — Progress Notes (Signed)
cc'd to pcp 

## 2018-07-02 ENCOUNTER — Telehealth: Payer: Self-pay | Admitting: Gastroenterology

## 2018-07-02 ENCOUNTER — Encounter: Payer: Self-pay | Admitting: Gastroenterology

## 2018-07-02 ENCOUNTER — Encounter (HOSPITAL_COMMUNITY): Payer: Self-pay | Admitting: Gastroenterology

## 2018-07-02 NOTE — Telephone Encounter (Signed)
PATIENT SCHEDULED AND LETTER SENT  °

## 2018-07-02 NOTE — Telephone Encounter (Signed)
LMOM to call.

## 2018-07-02 NOTE — Telephone Encounter (Signed)
Please call pt. HER stomach Bx shows gastritis. SHE HAD BENIGN POLYPS REMOVED FROM HER STOMACH.  HER SMALL BOWEL BIOPSIES ARE NORMAL.   DRINK WATER TO KEEP YOUR URINE LIGHT YELLOW. STRICTLY FOLLOW A DIABETIC  DIET.   MEDS THAT CAN CONTRIBUTE TO UPSET STOMACH INCLUDE: GLUCOPHAGE & MYLANTA. CONTINUE OMEPRAZOLE.  TAKE 30 MINUTES PRIOR TO YOUR MEALS ONCE OR TWICE DAILY.  FOLLOW UP IN 4 MOS. You should have a complete colonoscopy before the end of 2020.

## 2018-07-08 NOTE — Telephone Encounter (Signed)
LMOM to call. Mailing a letter to call also.  

## 2018-07-17 NOTE — Telephone Encounter (Signed)
Lmom, waiting on a return call.  

## 2018-07-17 NOTE — Telephone Encounter (Signed)
Patient received DS letter and states she has called and left 3 VM's with no return call. Fowarding to AM

## 2018-07-17 NOTE — Telephone Encounter (Signed)
Pt returned call and was given results. appt was previously scheduled.

## 2018-09-04 ENCOUNTER — Other Ambulatory Visit: Payer: Self-pay

## 2018-09-04 DIAGNOSIS — D582 Other hemoglobinopathies: Secondary | ICD-10-CM

## 2018-10-31 ENCOUNTER — Ambulatory Visit: Payer: Medicare Other | Admitting: Gastroenterology

## 2018-10-31 NOTE — Progress Notes (Deleted)
Primary Care Physician: Monico Blitz, MD  Primary Gastroenterologist:  Barney Drain, MD   No chief complaint on file.   HPI: Kristin Farley is a 73 y.o. female here  EGD 06/2018 with mild gastritis, gastric polyps (hyperplastic polyp), benign appearing esophageal stricture s/p dilation. Gastric biopsies without h.pylori, duodenal biopsies benign. No celiac.   H/O incomplete colonoscopy 59/7416 by Dr. Leighton Ruff. Scope could not be passed beyond transverse colon due to significant looping and tortuous colon. Follow up barium enema was normal.   Current Outpatient Medications  Medication Sig Dispense Refill  . acetaminophen (TYLENOL) 650 MG CR tablet Take 650 mg by mouth every 8 (eight) hours as needed for pain.    Marland Kitchen Alum & Mag Hydroxide-Simeth (MYLANTA PO) Take 15-30 mLs by mouth 2 (two) times daily as needed (upset stomach).     . Ascorbic Acid (VITAMIN C PO) Take 1 tablet by mouth daily.     Marland Kitchen aspirin EC 81 MG tablet Take 81 mg by mouth daily.    Marland Kitchen atenolol (TENORMIN) 25 MG tablet Take 25 mg by mouth at bedtime.     Marland Kitchen atorvastatin (LIPITOR) 20 MG tablet Take 20 mg by mouth every evening.     . B Complex Vitamins (B COMPLEX PO) Take 1 capsule by mouth daily.     . benazepril (LOTENSIN) 5 MG tablet Take 5 mg by mouth daily.     . Cholecalciferol (VITAMIN D) 2000 units tablet Take 2,000 Units by mouth daily.    . diclofenac (VOLTAREN) 75 MG EC tablet Take 75 mg by mouth 2 (two) times daily.    Marland Kitchen docusate sodium (COLACE) 100 MG capsule Take 100 mg by mouth at bedtime.    Marland Kitchen l-methylfolate-B6-B12 (METANX) 3-35-2 MG TABS tablet Take 1 tablet by mouth daily.    . metFORMIN (GLUCOPHAGE) 500 MG tablet Take 500 mg by mouth 2 (two) times daily with a meal.     . omeprazole (PRILOSEC) 20 MG capsule Take 20 mg by mouth 2 (two) times daily.    Marland Kitchen omeprazole (PRILOSEC) 20 MG capsule 1 PO 30 mins prior to breakfast and supper 60 capsule 11  . TURMERIC PO Take 1 tablet by mouth daily.       No current facility-administered medications for this visit.     Allergies as of 10/31/2018 - Review Complete 06/28/2018  Allergen Reaction Noted  . Sulfur Nausea And Vomiting 10/17/2017    ROS:  General: Negative for anorexia, weight loss, fever, chills, fatigue, weakness. ENT: Negative for hoarseness, difficulty swallowing , nasal congestion. CV: Negative for chest pain, angina, palpitations, dyspnea on exertion, peripheral edema.  Respiratory: Negative for dyspnea at rest, dyspnea on exertion, cough, sputum, wheezing.  GI: See history of present illness. GU:  Negative for dysuria, hematuria, urinary incontinence, urinary frequency, nocturnal urination.  Endo: Negative for unusual weight change.    Physical Examination:   There were no vitals taken for this visit.  General: Well-nourished, well-developed in no acute distress.  Eyes: No icterus. Mouth: Oropharyngeal mucosa moist and pink , no lesions erythema or exudate. Lungs: Clear to auscultation bilaterally.  Heart: Regular rate and rhythm, no murmurs rubs or gallops.  Abdomen: Bowel sounds are normal, nontender, nondistended, no hepatosplenomegaly or masses, no abdominal bruits or hernia , no rebound or guarding.   Extremities: No lower extremity edema. No clubbing or deformities. Neuro: Alert and oriented x 4   Skin: Warm and dry, no jaundice.   Psych:  Alert and cooperative, normal mood and affect.  Labs:  ***  Imaging Studies: No results found.

## 2019-05-06 DIAGNOSIS — E119 Type 2 diabetes mellitus without complications: Secondary | ICD-10-CM | POA: Diagnosis not present

## 2019-05-15 DIAGNOSIS — E1140 Type 2 diabetes mellitus with diabetic neuropathy, unspecified: Secondary | ICD-10-CM | POA: Diagnosis not present

## 2019-05-22 ENCOUNTER — Other Ambulatory Visit: Payer: Self-pay

## 2019-05-22 ENCOUNTER — Ambulatory Visit: Payer: Medicare Other | Attending: Internal Medicine

## 2019-05-22 DIAGNOSIS — Z23 Encounter for immunization: Secondary | ICD-10-CM | POA: Insufficient documentation

## 2019-05-22 NOTE — Progress Notes (Signed)
   Covid-19 Vaccination Clinic  Name:  Kristin Farley    MRN: [id number] DOB: 1945/10/06  05/22/2019  Kristin Farley was observed post Covid-19 immunization for 15 minutes without incidence. She was provided with Vaccine Information Sheet and instruction to access the V-Safe system.   Kristin Farley was instructed to call 911 with any severe reactions post vaccine: Marland Kitchen Difficulty breathing  . Swelling of your face and throat  . A fast heartbeat  . A bad rash all over your body  . Dizziness and weakness    Immunizations Administered    Name Date Dose VIS Date Route   Moderna COVID-19 Vaccine 05/22/2019 11:44 AM 0.5 mL 03/11/2019 Intramuscular   Manufacturer: Moderna   Lot: [id number]   NDC: 88301-415-97

## 2019-06-03 DIAGNOSIS — E1142 Type 2 diabetes mellitus with diabetic polyneuropathy: Secondary | ICD-10-CM | POA: Diagnosis not present

## 2019-06-03 DIAGNOSIS — Z789 Other specified health status: Secondary | ICD-10-CM | POA: Diagnosis not present

## 2019-06-03 DIAGNOSIS — I10 Essential (primary) hypertension: Secondary | ICD-10-CM | POA: Diagnosis not present

## 2019-06-03 DIAGNOSIS — E1159 Type 2 diabetes mellitus with other circulatory complications: Secondary | ICD-10-CM | POA: Diagnosis not present

## 2019-06-03 DIAGNOSIS — E1165 Type 2 diabetes mellitus with hyperglycemia: Secondary | ICD-10-CM | POA: Diagnosis not present

## 2019-06-03 DIAGNOSIS — Z299 Encounter for prophylactic measures, unspecified: Secondary | ICD-10-CM | POA: Diagnosis not present

## 2019-06-08 DIAGNOSIS — E119 Type 2 diabetes mellitus without complications: Secondary | ICD-10-CM | POA: Diagnosis not present

## 2019-06-12 DIAGNOSIS — E1140 Type 2 diabetes mellitus with diabetic neuropathy, unspecified: Secondary | ICD-10-CM | POA: Diagnosis not present

## 2019-06-23 ENCOUNTER — Ambulatory Visit: Payer: Medicare Other | Attending: Internal Medicine

## 2019-06-23 DIAGNOSIS — Z23 Encounter for immunization: Secondary | ICD-10-CM

## 2019-06-23 NOTE — Progress Notes (Signed)
   Covid-19 Vaccination Clinic  Name:  Kristin Farley    MRN: [id number] DOB: [birth year]  06/23/2019  Kristin Farley was observed post Covid-19 immunization for 15 minutes without incident. She was provided with Vaccine Information Sheet and instruction to access the V-Safe system.   Kristin Farley was instructed to call 911 with any severe reactions post vaccine: Marland Kitchen Difficulty breathing  . Swelling of face and throat  . A fast heartbeat  . A bad rash all over body  . Dizziness and weakness   Immunizations Administered    Name Date Dose VIS Date Route   Moderna COVID-19 Vaccine 06/23/2019  9:32 AM 0.5 mL 03/11/2019 Intramuscular   Manufacturer: Moderna   Lot: [id number]   NDC: 38871-959-74

## 2019-07-08 DIAGNOSIS — E119 Type 2 diabetes mellitus without complications: Secondary | ICD-10-CM | POA: Diagnosis not present

## 2019-08-08 DIAGNOSIS — E119 Type 2 diabetes mellitus without complications: Secondary | ICD-10-CM | POA: Diagnosis not present

## 2019-08-29 DIAGNOSIS — I10 Essential (primary) hypertension: Secondary | ICD-10-CM | POA: Diagnosis not present

## 2019-08-29 DIAGNOSIS — M19011 Primary osteoarthritis, right shoulder: Secondary | ICD-10-CM | POA: Diagnosis not present

## 2019-08-29 DIAGNOSIS — M7551 Bursitis of right shoulder: Secondary | ICD-10-CM | POA: Diagnosis not present

## 2019-08-29 DIAGNOSIS — Z299 Encounter for prophylactic measures, unspecified: Secondary | ICD-10-CM | POA: Diagnosis not present

## 2019-08-29 DIAGNOSIS — M25561 Pain in right knee: Secondary | ICD-10-CM | POA: Diagnosis not present

## 2019-08-29 DIAGNOSIS — Z789 Other specified health status: Secondary | ICD-10-CM | POA: Diagnosis not present

## 2019-09-03 DIAGNOSIS — E1142 Type 2 diabetes mellitus with diabetic polyneuropathy: Secondary | ICD-10-CM | POA: Diagnosis not present

## 2019-09-03 DIAGNOSIS — E1165 Type 2 diabetes mellitus with hyperglycemia: Secondary | ICD-10-CM | POA: Diagnosis not present

## 2019-09-03 DIAGNOSIS — G4733 Obstructive sleep apnea (adult) (pediatric): Secondary | ICD-10-CM | POA: Diagnosis not present

## 2019-09-03 DIAGNOSIS — I10 Essential (primary) hypertension: Secondary | ICD-10-CM | POA: Diagnosis not present

## 2019-09-03 DIAGNOSIS — Z299 Encounter for prophylactic measures, unspecified: Secondary | ICD-10-CM | POA: Diagnosis not present

## 2019-09-07 DIAGNOSIS — E119 Type 2 diabetes mellitus without complications: Secondary | ICD-10-CM | POA: Diagnosis not present

## 2019-09-11 DIAGNOSIS — E1140 Type 2 diabetes mellitus with diabetic neuropathy, unspecified: Secondary | ICD-10-CM | POA: Diagnosis not present

## 2019-10-08 DIAGNOSIS — E119 Type 2 diabetes mellitus without complications: Secondary | ICD-10-CM | POA: Diagnosis not present

## 2019-10-23 DIAGNOSIS — Z0000 Encounter for general adult medical examination without abnormal findings: Secondary | ICD-10-CM | POA: Diagnosis not present

## 2019-10-23 DIAGNOSIS — R5383 Other fatigue: Secondary | ICD-10-CM | POA: Diagnosis not present

## 2019-10-23 DIAGNOSIS — E7800 Pure hypercholesterolemia, unspecified: Secondary | ICD-10-CM | POA: Diagnosis not present

## 2019-10-23 DIAGNOSIS — Z299 Encounter for prophylactic measures, unspecified: Secondary | ICD-10-CM | POA: Diagnosis not present

## 2019-10-23 DIAGNOSIS — E559 Vitamin D deficiency, unspecified: Secondary | ICD-10-CM | POA: Diagnosis not present

## 2019-10-23 DIAGNOSIS — Z79899 Other long term (current) drug therapy: Secondary | ICD-10-CM | POA: Diagnosis not present

## 2019-10-23 DIAGNOSIS — Z7189 Other specified counseling: Secondary | ICD-10-CM | POA: Diagnosis not present

## 2019-11-07 DIAGNOSIS — E119 Type 2 diabetes mellitus without complications: Secondary | ICD-10-CM | POA: Diagnosis not present

## 2019-12-01 DIAGNOSIS — E1140 Type 2 diabetes mellitus with diabetic neuropathy, unspecified: Secondary | ICD-10-CM | POA: Diagnosis not present

## 2019-12-09 DIAGNOSIS — E119 Type 2 diabetes mellitus without complications: Secondary | ICD-10-CM | POA: Diagnosis not present

## 2020-01-08 DIAGNOSIS — E119 Type 2 diabetes mellitus without complications: Secondary | ICD-10-CM | POA: Diagnosis not present

## 2020-02-03 DIAGNOSIS — I10 Essential (primary) hypertension: Secondary | ICD-10-CM | POA: Diagnosis not present

## 2020-02-03 DIAGNOSIS — E1142 Type 2 diabetes mellitus with diabetic polyneuropathy: Secondary | ICD-10-CM | POA: Diagnosis not present

## 2020-02-03 DIAGNOSIS — E1159 Type 2 diabetes mellitus with other circulatory complications: Secondary | ICD-10-CM | POA: Diagnosis not present

## 2020-02-03 DIAGNOSIS — I152 Hypertension secondary to endocrine disorders: Secondary | ICD-10-CM | POA: Diagnosis not present

## 2020-02-03 DIAGNOSIS — Z299 Encounter for prophylactic measures, unspecified: Secondary | ICD-10-CM | POA: Diagnosis not present

## 2020-02-03 DIAGNOSIS — E1165 Type 2 diabetes mellitus with hyperglycemia: Secondary | ICD-10-CM | POA: Diagnosis not present

## 2020-02-07 DIAGNOSIS — E119 Type 2 diabetes mellitus without complications: Secondary | ICD-10-CM | POA: Diagnosis not present

## 2020-03-02 DIAGNOSIS — M79604 Pain in right leg: Secondary | ICD-10-CM | POA: Diagnosis not present

## 2020-03-02 DIAGNOSIS — M79605 Pain in left leg: Secondary | ICD-10-CM | POA: Diagnosis not present

## 2020-03-02 DIAGNOSIS — E1159 Type 2 diabetes mellitus with other circulatory complications: Secondary | ICD-10-CM | POA: Diagnosis not present

## 2020-03-02 DIAGNOSIS — E1143 Type 2 diabetes mellitus with diabetic autonomic (poly)neuropathy: Secondary | ICD-10-CM | POA: Diagnosis not present

## 2020-03-08 DIAGNOSIS — E1140 Type 2 diabetes mellitus with diabetic neuropathy, unspecified: Secondary | ICD-10-CM | POA: Diagnosis not present

## 2020-03-09 DIAGNOSIS — E119 Type 2 diabetes mellitus without complications: Secondary | ICD-10-CM | POA: Diagnosis not present

## 2020-03-26 DIAGNOSIS — I10 Essential (primary) hypertension: Secondary | ICD-10-CM | POA: Diagnosis not present

## 2020-03-26 DIAGNOSIS — Z713 Dietary counseling and surveillance: Secondary | ICD-10-CM | POA: Diagnosis not present

## 2020-03-26 DIAGNOSIS — Z299 Encounter for prophylactic measures, unspecified: Secondary | ICD-10-CM | POA: Diagnosis not present

## 2020-03-26 DIAGNOSIS — M7062 Trochanteric bursitis, left hip: Secondary | ICD-10-CM | POA: Diagnosis not present

## 2020-04-08 DIAGNOSIS — E119 Type 2 diabetes mellitus without complications: Secondary | ICD-10-CM | POA: Diagnosis not present

## 2020-05-10 DIAGNOSIS — E119 Type 2 diabetes mellitus without complications: Secondary | ICD-10-CM | POA: Diagnosis not present

## 2020-05-14 DIAGNOSIS — M25521 Pain in right elbow: Secondary | ICD-10-CM | POA: Diagnosis not present

## 2020-05-14 DIAGNOSIS — Z299 Encounter for prophylactic measures, unspecified: Secondary | ICD-10-CM | POA: Diagnosis not present

## 2020-05-14 DIAGNOSIS — G43909 Migraine, unspecified, not intractable, without status migrainosus: Secondary | ICD-10-CM | POA: Diagnosis not present

## 2020-05-14 DIAGNOSIS — Z789 Other specified health status: Secondary | ICD-10-CM | POA: Diagnosis not present

## 2020-05-14 DIAGNOSIS — E1165 Type 2 diabetes mellitus with hyperglycemia: Secondary | ICD-10-CM | POA: Diagnosis not present

## 2020-05-14 DIAGNOSIS — G4733 Obstructive sleep apnea (adult) (pediatric): Secondary | ICD-10-CM | POA: Diagnosis not present

## 2020-05-27 DIAGNOSIS — E1140 Type 2 diabetes mellitus with diabetic neuropathy, unspecified: Secondary | ICD-10-CM | POA: Diagnosis not present

## 2020-06-07 DIAGNOSIS — E119 Type 2 diabetes mellitus without complications: Secondary | ICD-10-CM | POA: Diagnosis not present

## 2020-06-25 DIAGNOSIS — M25521 Pain in right elbow: Secondary | ICD-10-CM | POA: Diagnosis not present

## 2020-06-25 DIAGNOSIS — Z299 Encounter for prophylactic measures, unspecified: Secondary | ICD-10-CM | POA: Diagnosis not present

## 2020-06-25 DIAGNOSIS — R030 Elevated blood-pressure reading, without diagnosis of hypertension: Secondary | ICD-10-CM | POA: Diagnosis not present

## 2020-06-25 DIAGNOSIS — E1165 Type 2 diabetes mellitus with hyperglycemia: Secondary | ICD-10-CM | POA: Diagnosis not present

## 2020-06-25 DIAGNOSIS — I10 Essential (primary) hypertension: Secondary | ICD-10-CM | POA: Diagnosis not present

## 2020-06-25 DIAGNOSIS — E1142 Type 2 diabetes mellitus with diabetic polyneuropathy: Secondary | ICD-10-CM | POA: Diagnosis not present

## 2020-07-05 DIAGNOSIS — M7551 Bursitis of right shoulder: Secondary | ICD-10-CM | POA: Diagnosis not present

## 2020-07-05 DIAGNOSIS — E1165 Type 2 diabetes mellitus with hyperglycemia: Secondary | ICD-10-CM | POA: Diagnosis not present

## 2020-07-05 DIAGNOSIS — I10 Essential (primary) hypertension: Secondary | ICD-10-CM | POA: Diagnosis not present

## 2020-07-05 DIAGNOSIS — Z299 Encounter for prophylactic measures, unspecified: Secondary | ICD-10-CM | POA: Diagnosis not present

## 2020-07-08 DIAGNOSIS — E119 Type 2 diabetes mellitus without complications: Secondary | ICD-10-CM | POA: Diagnosis not present

## 2020-08-06 DIAGNOSIS — E119 Type 2 diabetes mellitus without complications: Secondary | ICD-10-CM | POA: Diagnosis not present

## 2020-08-30 DIAGNOSIS — S6991XA Unspecified injury of right wrist, hand and finger(s), initial encounter: Secondary | ICD-10-CM | POA: Diagnosis not present

## 2020-08-30 DIAGNOSIS — Z23 Encounter for immunization: Secondary | ICD-10-CM | POA: Diagnosis not present

## 2020-08-30 DIAGNOSIS — S0181XA Laceration without foreign body of other part of head, initial encounter: Secondary | ICD-10-CM | POA: Diagnosis not present

## 2020-08-30 DIAGNOSIS — S52601A Unspecified fracture of lower end of right ulna, initial encounter for closed fracture: Secondary | ICD-10-CM | POA: Diagnosis not present

## 2020-09-07 DIAGNOSIS — E119 Type 2 diabetes mellitus without complications: Secondary | ICD-10-CM | POA: Diagnosis not present

## 2020-09-10 DIAGNOSIS — M25531 Pain in right wrist: Secondary | ICD-10-CM | POA: Diagnosis not present

## 2020-09-10 DIAGNOSIS — S63501A Unspecified sprain of right wrist, initial encounter: Secondary | ICD-10-CM | POA: Diagnosis not present

## 2020-09-13 DIAGNOSIS — E1140 Type 2 diabetes mellitus with diabetic neuropathy, unspecified: Secondary | ICD-10-CM | POA: Diagnosis not present

## 2020-09-28 DIAGNOSIS — E1159 Type 2 diabetes mellitus with other circulatory complications: Secondary | ICD-10-CM | POA: Diagnosis not present

## 2020-09-28 DIAGNOSIS — I152 Hypertension secondary to endocrine disorders: Secondary | ICD-10-CM | POA: Diagnosis not present

## 2020-09-28 DIAGNOSIS — Z789 Other specified health status: Secondary | ICD-10-CM | POA: Diagnosis not present

## 2020-09-28 DIAGNOSIS — Z299 Encounter for prophylactic measures, unspecified: Secondary | ICD-10-CM | POA: Diagnosis not present

## 2020-09-28 DIAGNOSIS — J069 Acute upper respiratory infection, unspecified: Secondary | ICD-10-CM | POA: Diagnosis not present

## 2020-09-28 DIAGNOSIS — E1142 Type 2 diabetes mellitus with diabetic polyneuropathy: Secondary | ICD-10-CM | POA: Diagnosis not present

## 2020-10-07 DIAGNOSIS — E119 Type 2 diabetes mellitus without complications: Secondary | ICD-10-CM | POA: Diagnosis not present

## 2020-10-08 DIAGNOSIS — S63501D Unspecified sprain of right wrist, subsequent encounter: Secondary | ICD-10-CM | POA: Diagnosis not present

## 2020-11-03 DIAGNOSIS — Z7189 Other specified counseling: Secondary | ICD-10-CM | POA: Diagnosis not present

## 2020-11-03 DIAGNOSIS — E1165 Type 2 diabetes mellitus with hyperglycemia: Secondary | ICD-10-CM | POA: Diagnosis not present

## 2020-11-03 DIAGNOSIS — Z0000 Encounter for general adult medical examination without abnormal findings: Secondary | ICD-10-CM | POA: Diagnosis not present

## 2020-11-03 DIAGNOSIS — Z299 Encounter for prophylactic measures, unspecified: Secondary | ICD-10-CM | POA: Diagnosis not present

## 2020-11-05 DIAGNOSIS — E119 Type 2 diabetes mellitus without complications: Secondary | ICD-10-CM | POA: Diagnosis not present

## 2020-11-07 DIAGNOSIS — E119 Type 2 diabetes mellitus without complications: Secondary | ICD-10-CM | POA: Diagnosis not present

## 2020-11-09 DIAGNOSIS — Z79899 Other long term (current) drug therapy: Secondary | ICD-10-CM | POA: Diagnosis not present

## 2020-11-09 DIAGNOSIS — E559 Vitamin D deficiency, unspecified: Secondary | ICD-10-CM | POA: Diagnosis not present

## 2020-11-09 DIAGNOSIS — E7800 Pure hypercholesterolemia, unspecified: Secondary | ICD-10-CM | POA: Diagnosis not present

## 2020-11-09 DIAGNOSIS — R5383 Other fatigue: Secondary | ICD-10-CM | POA: Diagnosis not present

## 2020-11-19 DIAGNOSIS — Z299 Encounter for prophylactic measures, unspecified: Secondary | ICD-10-CM | POA: Diagnosis not present

## 2020-11-19 DIAGNOSIS — M7551 Bursitis of right shoulder: Secondary | ICD-10-CM | POA: Diagnosis not present

## 2020-11-19 DIAGNOSIS — Z713 Dietary counseling and surveillance: Secondary | ICD-10-CM | POA: Diagnosis not present

## 2020-11-19 DIAGNOSIS — M19011 Primary osteoarthritis, right shoulder: Secondary | ICD-10-CM | POA: Diagnosis not present

## 2020-11-19 DIAGNOSIS — I10 Essential (primary) hypertension: Secondary | ICD-10-CM | POA: Diagnosis not present

## 2020-11-30 DIAGNOSIS — E1140 Type 2 diabetes mellitus with diabetic neuropathy, unspecified: Secondary | ICD-10-CM | POA: Diagnosis not present

## 2020-12-08 DIAGNOSIS — E119 Type 2 diabetes mellitus without complications: Secondary | ICD-10-CM | POA: Diagnosis not present

## 2020-12-20 DIAGNOSIS — Z1231 Encounter for screening mammogram for malignant neoplasm of breast: Secondary | ICD-10-CM | POA: Diagnosis not present

## 2021-01-07 DIAGNOSIS — E119 Type 2 diabetes mellitus without complications: Secondary | ICD-10-CM | POA: Diagnosis not present

## 2021-02-07 DIAGNOSIS — E119 Type 2 diabetes mellitus without complications: Secondary | ICD-10-CM | POA: Diagnosis not present

## 2021-02-14 DIAGNOSIS — Z2821 Immunization not carried out because of patient refusal: Secondary | ICD-10-CM | POA: Diagnosis not present

## 2021-02-14 DIAGNOSIS — E1142 Type 2 diabetes mellitus with diabetic polyneuropathy: Secondary | ICD-10-CM | POA: Diagnosis not present

## 2021-02-14 DIAGNOSIS — R059 Cough, unspecified: Secondary | ICD-10-CM | POA: Diagnosis not present

## 2021-02-14 DIAGNOSIS — M1710 Unilateral primary osteoarthritis, unspecified knee: Secondary | ICD-10-CM | POA: Diagnosis not present

## 2021-02-14 DIAGNOSIS — E1165 Type 2 diabetes mellitus with hyperglycemia: Secondary | ICD-10-CM | POA: Diagnosis not present

## 2021-02-14 DIAGNOSIS — Z299 Encounter for prophylactic measures, unspecified: Secondary | ICD-10-CM | POA: Diagnosis not present

## 2021-02-14 DIAGNOSIS — I10 Essential (primary) hypertension: Secondary | ICD-10-CM | POA: Diagnosis not present

## 2021-02-21 DIAGNOSIS — E1140 Type 2 diabetes mellitus with diabetic neuropathy, unspecified: Secondary | ICD-10-CM | POA: Diagnosis not present

## 2021-02-28 DIAGNOSIS — Z299 Encounter for prophylactic measures, unspecified: Secondary | ICD-10-CM | POA: Diagnosis not present

## 2021-02-28 DIAGNOSIS — I10 Essential (primary) hypertension: Secondary | ICD-10-CM | POA: Diagnosis not present

## 2021-02-28 DIAGNOSIS — M19011 Primary osteoarthritis, right shoulder: Secondary | ICD-10-CM | POA: Diagnosis not present

## 2021-02-28 DIAGNOSIS — B372 Candidiasis of skin and nail: Secondary | ICD-10-CM | POA: Diagnosis not present

## 2021-02-28 DIAGNOSIS — R059 Cough, unspecified: Secondary | ICD-10-CM | POA: Diagnosis not present

## 2021-03-09 DIAGNOSIS — E119 Type 2 diabetes mellitus without complications: Secondary | ICD-10-CM | POA: Diagnosis not present

## 2021-04-08 DIAGNOSIS — E119 Type 2 diabetes mellitus without complications: Secondary | ICD-10-CM | POA: Diagnosis not present

## 2021-04-15 DIAGNOSIS — M19011 Primary osteoarthritis, right shoulder: Secondary | ICD-10-CM | POA: Diagnosis not present

## 2021-04-15 DIAGNOSIS — M25511 Pain in right shoulder: Secondary | ICD-10-CM | POA: Diagnosis not present

## 2021-04-22 DIAGNOSIS — I10 Essential (primary) hypertension: Secondary | ICD-10-CM | POA: Diagnosis not present

## 2021-04-22 DIAGNOSIS — M19011 Primary osteoarthritis, right shoulder: Secondary | ICD-10-CM | POA: Diagnosis not present

## 2021-04-22 DIAGNOSIS — M25511 Pain in right shoulder: Secondary | ICD-10-CM | POA: Diagnosis not present

## 2021-04-22 DIAGNOSIS — Z713 Dietary counseling and surveillance: Secondary | ICD-10-CM | POA: Diagnosis not present

## 2021-04-22 DIAGNOSIS — Z299 Encounter for prophylactic measures, unspecified: Secondary | ICD-10-CM | POA: Diagnosis not present

## 2021-05-02 DIAGNOSIS — E1140 Type 2 diabetes mellitus with diabetic neuropathy, unspecified: Secondary | ICD-10-CM | POA: Diagnosis not present

## 2021-05-05 DIAGNOSIS — M67813 Other specified disorders of tendon, right shoulder: Secondary | ICD-10-CM | POA: Diagnosis not present

## 2021-05-05 DIAGNOSIS — M25511 Pain in right shoulder: Secondary | ICD-10-CM | POA: Diagnosis not present

## 2021-05-05 DIAGNOSIS — S46011A Strain of muscle(s) and tendon(s) of the rotator cuff of right shoulder, initial encounter: Secondary | ICD-10-CM | POA: Diagnosis not present

## 2021-05-05 DIAGNOSIS — M71811 Other specified bursopathies, right shoulder: Secondary | ICD-10-CM | POA: Diagnosis not present

## 2021-05-08 DIAGNOSIS — E119 Type 2 diabetes mellitus without complications: Secondary | ICD-10-CM | POA: Diagnosis not present

## 2021-05-11 DIAGNOSIS — Z789 Other specified health status: Secondary | ICD-10-CM | POA: Diagnosis not present

## 2021-05-11 DIAGNOSIS — M75100 Unspecified rotator cuff tear or rupture of unspecified shoulder, not specified as traumatic: Secondary | ICD-10-CM | POA: Diagnosis not present

## 2021-05-11 DIAGNOSIS — E1165 Type 2 diabetes mellitus with hyperglycemia: Secondary | ICD-10-CM | POA: Diagnosis not present

## 2021-05-11 DIAGNOSIS — Z299 Encounter for prophylactic measures, unspecified: Secondary | ICD-10-CM | POA: Diagnosis not present

## 2021-05-11 DIAGNOSIS — I10 Essential (primary) hypertension: Secondary | ICD-10-CM | POA: Diagnosis not present

## 2021-05-11 DIAGNOSIS — G4733 Obstructive sleep apnea (adult) (pediatric): Secondary | ICD-10-CM | POA: Diagnosis not present

## 2021-05-20 DIAGNOSIS — E1165 Type 2 diabetes mellitus with hyperglycemia: Secondary | ICD-10-CM | POA: Diagnosis not present

## 2021-05-20 DIAGNOSIS — M75100 Unspecified rotator cuff tear or rupture of unspecified shoulder, not specified as traumatic: Secondary | ICD-10-CM | POA: Diagnosis not present

## 2021-05-20 DIAGNOSIS — Z299 Encounter for prophylactic measures, unspecified: Secondary | ICD-10-CM | POA: Diagnosis not present

## 2021-05-20 DIAGNOSIS — I10 Essential (primary) hypertension: Secondary | ICD-10-CM | POA: Diagnosis not present

## 2021-05-20 DIAGNOSIS — E1142 Type 2 diabetes mellitus with diabetic polyneuropathy: Secondary | ICD-10-CM | POA: Diagnosis not present

## 2021-05-20 DIAGNOSIS — M25511 Pain in right shoulder: Secondary | ICD-10-CM | POA: Diagnosis not present

## 2021-05-30 DIAGNOSIS — H35031 Hypertensive retinopathy, right eye: Secondary | ICD-10-CM | POA: Diagnosis not present

## 2021-06-01 DIAGNOSIS — E1165 Type 2 diabetes mellitus with hyperglycemia: Secondary | ICD-10-CM | POA: Diagnosis not present

## 2021-06-01 DIAGNOSIS — U071 COVID-19: Secondary | ICD-10-CM | POA: Diagnosis not present

## 2021-06-01 DIAGNOSIS — Z299 Encounter for prophylactic measures, unspecified: Secondary | ICD-10-CM | POA: Diagnosis not present

## 2021-06-01 DIAGNOSIS — I10 Essential (primary) hypertension: Secondary | ICD-10-CM | POA: Diagnosis not present

## 2021-06-06 DIAGNOSIS — I10 Essential (primary) hypertension: Secondary | ICD-10-CM | POA: Diagnosis not present

## 2021-06-06 DIAGNOSIS — Z299 Encounter for prophylactic measures, unspecified: Secondary | ICD-10-CM | POA: Diagnosis not present

## 2021-06-06 DIAGNOSIS — Z789 Other specified health status: Secondary | ICD-10-CM | POA: Diagnosis not present

## 2021-06-06 DIAGNOSIS — E1142 Type 2 diabetes mellitus with diabetic polyneuropathy: Secondary | ICD-10-CM | POA: Diagnosis not present

## 2021-06-07 DIAGNOSIS — E119 Type 2 diabetes mellitus without complications: Secondary | ICD-10-CM | POA: Diagnosis not present

## 2021-06-30 DIAGNOSIS — H524 Presbyopia: Secondary | ICD-10-CM | POA: Diagnosis not present

## 2021-06-30 DIAGNOSIS — H35033 Hypertensive retinopathy, bilateral: Secondary | ICD-10-CM | POA: Diagnosis not present

## 2021-07-04 DIAGNOSIS — E7800 Pure hypercholesterolemia, unspecified: Secondary | ICD-10-CM | POA: Diagnosis not present

## 2021-07-04 DIAGNOSIS — Z299 Encounter for prophylactic measures, unspecified: Secondary | ICD-10-CM | POA: Diagnosis not present

## 2021-07-04 DIAGNOSIS — Z0000 Encounter for general adult medical examination without abnormal findings: Secondary | ICD-10-CM | POA: Diagnosis not present

## 2021-07-04 DIAGNOSIS — I10 Essential (primary) hypertension: Secondary | ICD-10-CM | POA: Diagnosis not present

## 2021-07-07 DIAGNOSIS — E119 Type 2 diabetes mellitus without complications: Secondary | ICD-10-CM | POA: Diagnosis not present

## 2021-07-11 DIAGNOSIS — E1140 Type 2 diabetes mellitus with diabetic neuropathy, unspecified: Secondary | ICD-10-CM | POA: Diagnosis not present

## 2021-07-28 ENCOUNTER — Ambulatory Visit: Payer: Self-pay | Admitting: Physician Assistant

## 2021-07-28 NOTE — H&P (View-Only) (Signed)
Kristin Farley is an 76 y.o. female.   ?Chief Complaint: right shoulder pain ?HPI: Kristin Farley comes in.  The patient is well known to me and worked up with a symptomatic, significant cuff tear with a complete cuff tear with retraction of 2.0 cm, fraying of the infraspinatus, AC joint arthropathy.  Again she is significantly symptomatic.   ? ?Past Medical History:  ?Diagnosis Date  ? Anxiety   ? Arthritis   ? "shoulders" (11/02/2017)  ? Frequency of urination   ? GERD (gastroesophageal reflux disease)   ? Heart murmur   ? High cholesterol   ? Migraines   ? "q 2-3 months" (11/02/2017)  ? MVP (mitral valve prolapse)   ? OSA (obstructive sleep apnea)   ? "need new mask" (11/02/2017)  ? Primary localized osteoarthritis of knee   ? Left  ? SOB (shortness of breath) on exertion   ? Stress incontinence   ? Type II diabetes mellitus (HCC)   ? 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 Urgency of urination   ? ? ?Past Surgical History:  ?Procedure Laterality Date  ? BIOPSY  06/28/2018  ? Procedure: BIOPSY;  Surgeon: Fields, Sandi L, MD;  Location: [facility];  Service: Endoscopy;;  ? CATARACT EXTRACTION W/ INTRAOCULAR LENS  IMPLANT, BILATERAL    ? COLONOSCOPY  03/22/2012  ?  Alicia Thomas, MD-[facility] BUT INCOMPLETE  ? DILATION AND CURETTAGE OF UTERUS    ? ESOPHAGOGASTRODUODENOSCOPY N/A 06/28/2018  ? Procedure: ESOPHAGOGASTRODUODENOSCOPY (EGD);  Surgeon: Fields, Sandi L, MD;  Location: [facility];  Service: Endoscopy;  Laterality: N/A;  12:00pm  ? JOINT REPLACEMENT    ? KNEE ARTHROSCOPY Left 01/2017  ? PUBOVAGINAL SLING  08/25/2011  ? Procedure: PUBO-VAGINAL SLING;  Surgeon: Sigmund I Tannenbaum, MD;  Location: [facility];  Service: Urology;  Laterality: N/A;  BOSTON SCIENTIFIC MESH UPHOLD LITE ANTERIOR VAULT REPAIR ?  ? TONSILLECTOMY AND ADENOIDECTOMY  AGE 26  ? TOTAL ABDOMINAL HYSTERECTOMY  1990  ? w/BSO  ? 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 Procedure: LEFT TOTAL  KNEE ARTHROPLASTY;  Surgeon: Caffrey, Daniel, MD;  Location: [facility];  Service: Orthopedics;  Laterality: Left;  ? ? ?Family History  ?Problem Relation Age of Onset  ? Cancer Mother   ?     lung  ? Alcohol abuse Father   ? Colon cancer Neg Hx   ? Colon polyps Neg Hx   ? ?Social History:  reports that she has never smoked. She has never used smokeless tobacco. She reports that she does not drink alcohol and does not use drugs. ? ?Allergies:  ?Allergies  ?Allergen Reactions  ? Elemental Sulfur Nausea And Vomiting  ? ? ?(Not in a hospital admission) ? ? ?No results found for this or any previous visit (from the past 48 hour(s)). ?No results found. ? ?Review of Systems  ?Musculoskeletal:  Positive for arthralgias.  ?All other systems reviewed and are negative. ? ?There were no vitals taken for this visit. ?Physical Exam ?Constitutional:   ?   General: She is not in acute distress. ?   Appearance: Normal appearance.  ?HENT:  ?   Head: Normocephalic and atraumatic.  ?Eyes:  ?   Extraocular Movements: Extraocular movements intact.  ?   Pupils: Pupils are equal, round, and reactive to light.  ?Cardiovascular:  ?   Rate and Rhythm: Normal rate.  ?   Pulses: Normal pulses.  ?Pulmonary:  ?     Effort: Pulmonary effort is normal. No respiratory distress.  ?Abdominal:  ?   General: Abdomen is flat. There is no distension.  ?Musculoskeletal:  ?   Cervical back: Normal range of motion and neck supple.  ?   Comments: She has positive impingement signs, positive empty can test, pain at night. She is having trouble sleeping.   ?Skin: ?   General: Skin is warm and dry.  ?   Findings: No erythema or rash.  ?Neurological:  ?   General: No focal deficit present.  ?   Mental Status: She is alert and oriented to person, place, and time.  ?Psychiatric:     ?   Mood and Affect: Mood normal.     ?   Behavior: Behavior normal.  ?  ? ?Assessment/Plan ?Her A1C is a bit elevated at 7.4 she states at her last visit.  She is interested, and it is  certainly reasonable, to fix her cuff tear.  I would probably elect to leave her biceps alone or consider tenotomy in her case, acromioplasty, distal clavicle, general anesthetic, nerve block.  She is still working although officially retired.  She also has to help take care of her grandson with disabilities.  Her grandson will be out of the picture, so to speak and away from her care, in the first week of May so she has a timeframe in mind.  I certainly do not want her to wait any longer than that but I think that is a very reasonable thing to consider based on her commitments so to speak.  She would barely qualify for elective surgery with this A1C and we need to get her A1C down a bit too.  She has time to do that as well.  She is having night pain and we will give her a onetime Rx for hydrocodone.  She is not going to take this daily but for breakthrough pain and given her situation I do not think that is unreasonable.  Follow-up pending surgery.   ? ?Olamide Carattini, PA-C ?07/28/2021, 10:20 PM ? ? ? ?

## 2021-07-28 NOTE — H&P (Signed)
Kristin Farley is an 76 y.o. female.   ?Chief Complaint: right shoulder pain ?HPI: Kristin Farley comes in.  The patient is well known to me and worked up with a symptomatic, significant cuff tear with a complete cuff tear with retraction of 2.0 cm, fraying of the infraspinatus, AC joint arthropathy.  Again she is significantly symptomatic.   ? ?Past Medical History:  ?Diagnosis Date  ? Anxiety   ? Arthritis   ? "shoulders" (11/02/2017)  ? Frequency of urination   ? GERD (gastroesophageal reflux disease)   ? Heart murmur   ? High cholesterol   ? Migraines   ? "q 2-3 months" (11/02/2017)  ? MVP (mitral valve prolapse)   ? OSA (obstructive sleep apnea)   ? "need new mask" (11/02/2017)  ? Primary localized osteoarthritis of knee   ? Left  ? SOB (shortness of breath) on exertion   ? Stress incontinence   ? Type II diabetes mellitus ([place])   ? 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 Urgency of urination   ? ? ?Past Surgical History:  ?Procedure Laterality Date  ? BIOPSY  06/28/2018  ? Procedure: BIOPSY;  Surgeon: Danie Binder, MD;  [facility];  Service: Endoscopy;;  ? CATARACT EXTRACTION W/ INTRAOCULAR LENS  IMPLANT, BILATERAL    ? COLONOSCOPY  03/22/2012  ?  Leighton Ruff, MD-NL BUT INCOMPLETE  ? DILATION AND CURETTAGE OF UTERUS    ? ESOPHAGOGASTRODUODENOSCOPY N/A 06/28/2018  ? Procedure: ESOPHAGOGASTRODUODENOSCOPY (EGD);  Surgeon: Danie Binder, MD;  [facility]: [facility];  Service: Endoscopy;  Laterality: N/A;  12:00pm  ? JOINT REPLACEMENT    ? KNEE ARTHROSCOPY Left 01/2017  ? PUBOVAGINAL SLING  08/25/2011  ? Procedure: Gaynelle Arabian;  Surgeon: Ailene Rud, MD;  Location: [facility];  Service: Urology;  Laterality: N/A;  BOSTON SCIENTIFIC MESH UPHOLD LITE ANTERIOR VAULT REPAIR ?  ? TONSILLECTOMY AND ADENOIDECTOMY  AGE 13  ? TOTAL ABDOMINAL HYSTERECTOMY  1990  ? w/BSO  ? 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 Procedure: LEFT TOTAL  KNEE ARTHROPLASTY;  Surgeon: Earlie Server, MD;  Location: [facility];  Service: Orthopedics;  Laterality: Left;  ? ? ?Family History  ?Problem Relation Age of Onset  ? Cancer Mother   ?     lung  ? Alcohol abuse Father   ? Colon cancer Neg Hx   ? Colon polyps Neg Hx   ? ?Social History:  reports that she has never smoked. She has never used smokeless tobacco. She reports that she does not drink alcohol and does not use drugs. ? ?Allergies:  ?Allergies  ?Allergen Reactions  ? Elemental Sulfur Nausea And Vomiting  ? ? ?(Not in a hospital admission) ? ? ?No results found for this or any previous visit (from the past 48 hour(s)). ?No results found. ? ?Review of Systems  ?Musculoskeletal:  Positive for arthralgias.  ?All other systems reviewed and are negative. ? ?There were no vitals taken for this visit. ?Physical Exam ?Constitutional:   ?   General: She is not in acute distress. ?   Appearance: Normal appearance.  ?HENT:  ?   Head: Normocephalic and atraumatic.  ?Eyes:  ?   Extraocular Movements: Extraocular movements intact.  ?   Pupils: Pupils are equal, round, and reactive to light.  ?Cardiovascular:  ?   Rate and Rhythm: Normal rate.  ?   Pulses: Normal pulses.  ?Pulmonary:  ?  Effort: Pulmonary effort is normal. No respiratory distress.  ?Abdominal:  ?   General: Abdomen is flat. There is no distension.  ?Musculoskeletal:  ?   Cervical back: Normal range of motion and neck supple.  ?   Comments: She has positive impingement signs, positive empty can test, pain at night. She is having trouble sleeping.   ?Skin: ?   General: Skin is warm and dry.  ?   Findings: No erythema or rash.  ?Neurological:  ?   General: No focal deficit present.  ?   Mental Status: She is alert and oriented to person, place, and time.  ?Psychiatric:     ?   Mood and Affect: Mood normal.     ?   Behavior: Behavior normal.  ?  ? ?Assessment/Plan ?Her A1C is a bit elevated at 7.4 she states at her last visit.  She is interested, and it is  certainly reasonable, to fix her cuff tear.  I would probably elect to leave her biceps alone or consider tenotomy in her case, acromioplasty, distal clavicle, general anesthetic, nerve block.  She is still working although officially retired.  She also has to help take care of her grandson with disabilities.  Her grandson will be out of the picture, so to speak and away from her care, in the first week of May so she has a timeframe in mind.  I certainly do not want her to wait any longer than that but I think that is a very reasonable thing to consider based on her commitments so to speak.  She would barely qualify for elective surgery with this A1C and we need to get her A1C down a bit too.  She has time to do that as well.  She is having night pain and we will give her a onetime Rx for hydrocodone.  She is not going to take this daily but for breakthrough pain and given her situation I do not think that is unreasonable.  Follow-up pending surgery.   ? ?Chriss Czar, PA-C ?07/28/2021, 10:20 PM ? ? ? ?

## 2021-08-01 ENCOUNTER — Other Ambulatory Visit: Payer: Self-pay

## 2021-08-01 ENCOUNTER — Encounter (HOSPITAL_BASED_OUTPATIENT_CLINIC_OR_DEPARTMENT_OTHER): Payer: Self-pay | Admitting: Orthopedic Surgery

## 2021-08-03 ENCOUNTER — Encounter (HOSPITAL_BASED_OUTPATIENT_CLINIC_OR_DEPARTMENT_OTHER)
Admission: RE | Admit: 2021-08-03 | Discharge: 2021-08-03 | Disposition: A | Discharge status: Home / Self Care | Payer: Medicare Other | Source: Ambulatory Visit | Attending: Orthopedic Surgery | Admitting: Orthopedic Surgery

## 2021-08-03 DIAGNOSIS — Z01812 Encounter for preprocedural laboratory examination: Secondary | ICD-10-CM | POA: Diagnosis not present

## 2021-08-03 LAB — BASIC METABOLIC PANEL
Anion gap: 8 (ref 5–15)
BUN: 10 mg/dL (ref 8–23)
CO2: 23 mmol/L (ref 22–32)
Calcium: 9.5 mg/dL (ref 8.9–10.3)
Chloride: 108 mmol/L (ref 98–111)
Creatinine, Ser: 0.9 mg/dL (ref 0.44–1.00)
GFR, Estimated: >60 mL/min (ref >60)
Glucose, Bld: 163 mg/dL — ABNORMAL HIGH (ref 70–99)
Potassium: 4.4 mmol/L (ref 3.5–5.1)
Sodium: 139 mmol/L (ref 135–145)

## 2021-08-03 NOTE — Progress Notes (Signed)

## 2021-08-07 DIAGNOSIS — E119 Type 2 diabetes mellitus without complications: Secondary | ICD-10-CM | POA: Diagnosis not present

## 2021-08-10 ENCOUNTER — Ambulatory Visit (HOSPITAL_BASED_OUTPATIENT_CLINIC_OR_DEPARTMENT_OTHER): Payer: Medicare Other | Admitting: Certified Registered"

## 2021-08-10 ENCOUNTER — Encounter (HOSPITAL_BASED_OUTPATIENT_CLINIC_OR_DEPARTMENT_OTHER): Payer: Self-pay | Admitting: Orthopedic Surgery

## 2021-08-10 ENCOUNTER — Other Ambulatory Visit: Payer: Self-pay

## 2021-08-10 ENCOUNTER — Ambulatory Visit (HOSPITAL_BASED_OUTPATIENT_CLINIC_OR_DEPARTMENT_OTHER)
Admission: RE | Admit: 2021-08-10 | Discharge: 2021-08-10 | Disposition: A | Discharge status: Home / Self Care | Payer: Medicare Other | Attending: Orthopedic Surgery | Admitting: Orthopedic Surgery

## 2021-08-10 ENCOUNTER — Encounter (HOSPITAL_BASED_OUTPATIENT_CLINIC_OR_DEPARTMENT_OTHER)
Admission: RE | Disposition: A | Discharge status: Home / Self Care | Payer: Self-pay | Source: Home / Self Care | Attending: Orthopedic Surgery

## 2021-08-10 DIAGNOSIS — M75121 Complete rotator cuff tear or rupture of right shoulder, not specified as traumatic: Secondary | ICD-10-CM | POA: Diagnosis not present

## 2021-08-10 DIAGNOSIS — I341 Nonrheumatic mitral (valve) prolapse: Secondary | ICD-10-CM

## 2021-08-10 DIAGNOSIS — G8918 Other acute postprocedural pain: Secondary | ICD-10-CM | POA: Diagnosis not present

## 2021-08-10 DIAGNOSIS — M19011 Primary osteoarthritis, right shoulder: Secondary | ICD-10-CM | POA: Diagnosis not present

## 2021-08-10 DIAGNOSIS — M7541 Impingement syndrome of right shoulder: Secondary | ICD-10-CM | POA: Diagnosis not present

## 2021-08-10 DIAGNOSIS — Z7984 Long term (current) use of oral hypoglycemic drugs: Secondary | ICD-10-CM | POA: Insufficient documentation

## 2021-08-10 DIAGNOSIS — M7521 Bicipital tendinitis, right shoulder: Secondary | ICD-10-CM | POA: Diagnosis not present

## 2021-08-10 DIAGNOSIS — Z79899 Other long term (current) drug therapy: Secondary | ICD-10-CM | POA: Diagnosis not present

## 2021-08-10 DIAGNOSIS — M75101 Unspecified rotator cuff tear or rupture of right shoulder, not specified as traumatic: Secondary | ICD-10-CM | POA: Diagnosis not present

## 2021-08-10 DIAGNOSIS — S43431A Superior glenoid labrum lesion of right shoulder, initial encounter: Secondary | ICD-10-CM

## 2021-08-10 DIAGNOSIS — S46011A Strain of muscle(s) and tendon(s) of the rotator cuff of right shoulder, initial encounter: Secondary | ICD-10-CM | POA: Diagnosis not present

## 2021-08-10 DIAGNOSIS — G4733 Obstructive sleep apnea (adult) (pediatric): Secondary | ICD-10-CM | POA: Insufficient documentation

## 2021-08-10 DIAGNOSIS — K219 Gastro-esophageal reflux disease without esophagitis: Secondary | ICD-10-CM | POA: Diagnosis not present

## 2021-08-10 DIAGNOSIS — I10 Essential (primary) hypertension: Secondary | ICD-10-CM | POA: Diagnosis not present

## 2021-08-10 DIAGNOSIS — X58XXXA Exposure to other specified factors, initial encounter: Secondary | ICD-10-CM | POA: Diagnosis not present

## 2021-08-10 DIAGNOSIS — S46811A Strain of other muscles, fascia and tendons at shoulder and upper arm level, right arm, initial encounter: Secondary | ICD-10-CM | POA: Diagnosis not present

## 2021-08-10 DIAGNOSIS — E119 Type 2 diabetes mellitus without complications: Secondary | ICD-10-CM | POA: Insufficient documentation

## 2021-08-10 DIAGNOSIS — M24111 Other articular cartilage disorders, right shoulder: Secondary | ICD-10-CM | POA: Diagnosis not present

## 2021-08-10 HISTORY — DX: Essential (primary) hypertension: I10

## 2021-08-10 HISTORY — PX: SHOULDER ARTHROSCOPY WITH OPEN ROTATOR CUFF REPAIR AND DISTAL CLAVICLE ACROMINECTOMY: SHX5683

## 2021-08-10 HISTORY — DX: Myoneural disorder, unspecified: G70.9

## 2021-08-10 LAB — GLUCOSE, CAPILLARY
Glucose-Capillary: 120 mg/dL — ABNORMAL HIGH (ref 70–99)
Glucose-Capillary: 130 mg/dL — ABNORMAL HIGH (ref 70–99)

## 2021-08-10 SURGERY — SHOULDER ARTHROSCOPY WITH OPEN ROTATOR CUFF REPAIR AND DISTAL CLAVICLE ACROMINECTOMY
Anesthesia: General | Site: Shoulder | Laterality: Right

## 2021-08-10 MED ORDER — CEFAZOLIN SODIUM-DEXTROSE 2-4 GM/100ML-% IV SOLN
INTRAVENOUS | Status: AC
  Filled 2021-08-10: qty 100

## 2021-08-10 MED ORDER — PHENYLEPHRINE HCL (PRESSORS) 10 MG/ML IV SOLN
INTRAVENOUS | Status: AC
  Filled 2021-08-10: qty 1

## 2021-08-10 MED ORDER — METOPROLOL TARTRATE 25 MG PO TABS
25.0000 mg | ORAL_TABLET | Freq: Once | ORAL | Status: AC
  Administered 2021-08-10: 25 mg via ORAL

## 2021-08-10 MED ORDER — SODIUM CHLORIDE 0.9 % IV SOLN: INTRAVENOUS | Status: DC

## 2021-08-10 MED ORDER — ONDANSETRON HCL 4 MG/2ML IJ SOLN
INTRAMUSCULAR | Status: DC | PRN
  Administered 2021-08-10: 4 mg via INTRAVENOUS

## 2021-08-10 MED ORDER — ATROPINE SULFATE 0.4 MG/ML IV SOLN
INTRAVENOUS | Status: AC
  Filled 2021-08-10: qty 1

## 2021-08-10 MED ORDER — MIDAZOLAM HCL 2 MG/2ML IJ SOLN
0.5000 mg | Freq: Once | INTRAMUSCULAR | Status: AC
Start: 2021-08-10 — End: 2021-08-10
  Administered 2021-08-10: 0.5 mg via INTRAVENOUS

## 2021-08-10 MED ORDER — CEFAZOLIN SODIUM-DEXTROSE 2-4 GM/100ML-% IV SOLN
2.0000 g | INTRAVENOUS | Status: AC
  Administered 2021-08-10: 2 g via INTRAVENOUS

## 2021-08-10 MED ORDER — HYDROCODONE-ACETAMINOPHEN 5-325 MG PO TABS: 1.0000 | ORAL_TABLET | ORAL | Status: DC | PRN

## 2021-08-10 MED ORDER — MORPHINE SULFATE (PF) 4 MG/ML IV SOLN
INTRAVENOUS | Status: AC
  Filled 2021-08-10: qty 1

## 2021-08-10 MED ORDER — METOPROLOL TARTRATE 25 MG PO TABS
ORAL_TABLET | ORAL | Status: AC
  Filled 2021-08-10: qty 1

## 2021-08-10 MED ORDER — PHENYLEPHRINE HCL-NACL 20-0.9 MG/250ML-% IV SOLN
INTRAVENOUS | Status: DC | PRN
  Administered 2021-08-10: 25 ug/min via INTRAVENOUS

## 2021-08-10 MED ORDER — ONDANSETRON HCL 4 MG PO TABS: 4.0000 mg | ORAL_TABLET | Freq: Four times a day (QID) | ORAL | Status: DC | PRN

## 2021-08-10 MED ORDER — METHYLPREDNISOLONE ACETATE 80 MG/ML IJ SUSP
INTRAMUSCULAR | Status: AC
  Filled 2021-08-10: qty 1

## 2021-08-10 MED ORDER — DEXAMETHASONE SODIUM PHOSPHATE 10 MG/ML IJ SOLN
INTRAMUSCULAR | Status: AC
  Filled 2021-08-10: qty 1

## 2021-08-10 MED ORDER — ONDANSETRON HCL 4 MG/2ML IJ SOLN
4.0000 mg | Freq: Four times a day (QID) | INTRAMUSCULAR | Status: DC | PRN
Start: 2021-08-10 — End: 2021-08-10

## 2021-08-10 MED ORDER — DOCUSATE SODIUM 100 MG PO CAPS: 100.0000 mg | ORAL_CAPSULE | Freq: Two times a day (BID) | ORAL | Status: DC

## 2021-08-10 MED ORDER — PROPOFOL 10 MG/ML IV BOLUS
INTRAVENOUS | Status: AC
  Filled 2021-08-10: qty 20

## 2021-08-10 MED ORDER — FENTANYL CITRATE (PF) 100 MCG/2ML IJ SOLN
INTRAMUSCULAR | Status: AC
  Filled 2021-08-10: qty 2

## 2021-08-10 MED ORDER — MORPHINE SULFATE (PF) 4 MG/ML IV SOLN: 0.5000 mg | INTRAVENOUS | Status: DC | PRN

## 2021-08-10 MED ORDER — FENTANYL CITRATE (PF) 100 MCG/2ML IJ SOLN
50.0000 ug | Freq: Once | INTRAMUSCULAR | Status: AC
  Administered 2021-08-10: 50 ug via INTRAVENOUS

## 2021-08-10 MED ORDER — PROPOFOL 10 MG/ML IV BOLUS
INTRAVENOUS | Status: DC | PRN
Start: 2021-08-10 — End: 2021-08-10
  Administered 2021-08-10: 120 mg via INTRAVENOUS

## 2021-08-10 MED ORDER — FENTANYL CITRATE (PF) 100 MCG/2ML IJ SOLN
INTRAMUSCULAR | Status: DC | PRN
  Administered 2021-08-10: 50 ug via INTRAVENOUS

## 2021-08-10 MED ORDER — BUPIVACAINE-EPINEPHRINE (PF) 0.5% -1:200000 IJ SOLN
INTRAMUSCULAR | Status: DC | PRN
  Administered 2021-08-10: 5 mL via PERINEURAL

## 2021-08-10 MED ORDER — LIDOCAINE 2% (20 MG/ML) 5 ML SYRINGE
INTRAMUSCULAR | Status: AC
  Filled 2021-08-10: qty 5

## 2021-08-10 MED ORDER — MIDAZOLAM HCL 2 MG/2ML IJ SOLN
INTRAMUSCULAR | Status: AC
  Filled 2021-08-10: qty 2

## 2021-08-10 MED ORDER — BUPIVACAINE LIPOSOME 1.3 % IJ SUSP
INTRAMUSCULAR | Status: DC | PRN
  Administered 2021-08-10: 10 mL via PERINEURAL

## 2021-08-10 MED ORDER — ACETAMINOPHEN 500 MG PO TABS
ORAL_TABLET | ORAL | Status: AC
  Filled 2021-08-10: qty 2

## 2021-08-10 MED ORDER — ONDANSETRON HCL 4 MG/2ML IJ SOLN
INTRAMUSCULAR | Status: AC
  Filled 2021-08-10: qty 2

## 2021-08-10 MED ORDER — LACTATED RINGERS IV SOLN: INTRAVENOUS | Status: DC

## 2021-08-10 MED ORDER — AMISULPRIDE (ANTIEMETIC) 5 MG/2ML IV SOLN: 10.0000 mg | Freq: Once | INTRAVENOUS | Status: DC | PRN

## 2021-08-10 MED ORDER — LIDOCAINE 2% (20 MG/ML) 5 ML SYRINGE
INTRAMUSCULAR | Status: DC | PRN
  Administered 2021-08-10: 20 mg via INTRAVENOUS

## 2021-08-10 MED ORDER — FENTANYL CITRATE (PF) 100 MCG/2ML IJ SOLN: 25.0000 ug | INTRAMUSCULAR | Status: DC | PRN

## 2021-08-10 MED ORDER — DEXAMETHASONE SODIUM PHOSPHATE 10 MG/ML IJ SOLN
INTRAMUSCULAR | Status: DC | PRN
  Administered 2021-08-10: 4 mg via INTRAVENOUS

## 2021-08-10 MED ORDER — ROCURONIUM BROMIDE 100 MG/10ML IV SOLN
INTRAVENOUS | Status: DC | PRN
  Administered 2021-08-10: 50 mg via INTRAVENOUS

## 2021-08-10 MED ORDER — BUPIVACAINE-EPINEPHRINE (PF) 0.5% -1:200000 IJ SOLN
INTRAMUSCULAR | Status: AC
  Filled 2021-08-10: qty 30

## 2021-08-10 MED ORDER — ACETAMINOPHEN 325 MG PO TABS: 325.0000 mg | ORAL_TABLET | Freq: Four times a day (QID) | ORAL | Status: DC | PRN

## 2021-08-10 MED ORDER — ACETAMINOPHEN 500 MG PO TABS
1000.0000 mg | ORAL_TABLET | Freq: Once | ORAL | Status: AC
  Administered 2021-08-10: 1000 mg via ORAL

## 2021-08-10 MED ORDER — ROCURONIUM BROMIDE 10 MG/ML (PF) SYRINGE
PREFILLED_SYRINGE | INTRAVENOUS | Status: AC
  Filled 2021-08-10: qty 10

## 2021-08-10 MED ORDER — SODIUM CHLORIDE 0.9 % IR SOLN
Status: DC | PRN
  Administered 2021-08-10: 3000 mL

## 2021-08-10 MED ORDER — METOCLOPRAMIDE HCL 5 MG PO TABS: 5.0000 mg | ORAL_TABLET | Freq: Three times a day (TID) | ORAL | Status: DC | PRN

## 2021-08-10 MED ORDER — METOCLOPRAMIDE HCL 5 MG/ML IJ SOLN: 5.0000 mg | Freq: Three times a day (TID) | INTRAMUSCULAR | Status: DC | PRN

## 2021-08-10 MED ORDER — SUGAMMADEX SODIUM 200 MG/2ML IV SOLN
INTRAVENOUS | Status: DC | PRN
  Administered 2021-08-10: 175 mg via INTRAVENOUS

## 2021-08-10 SURGICAL SUPPLY — 79 items
AID PSTN UNV HD RSTRNT DISP (MISCELLANEOUS) ×1
ANCH SUT 2 SWLK 19.1 CLS EYLT (Anchor) ×2 IMPLANT
ANCHOR SWIVELOCK BIO 4.75X19.1 (Anchor) ×2 IMPLANT
APL SKNCLS STERI-STRIP NONHPOA (GAUZE/BANDAGES/DRESSINGS) ×1
BENZOIN TINCTURE PRP APPL 2/3 (GAUZE/BANDAGES/DRESSINGS) ×1 IMPLANT
BLADE AVERAGE 25X9 (BLADE) IMPLANT
BLADE EXCALIBUR 4.0X13 (MISCELLANEOUS) ×1 IMPLANT
BLADE SURG 15 STRL LF DISP TIS (BLADE) IMPLANT
BLADE SURG 15 STRL SS (BLADE)
BUR EGG 3PK/BX (BURR) IMPLANT
BURR OVAL 8 FLU 4.0X13 (MISCELLANEOUS) IMPLANT
CANNULA 5.75X71 LONG (CANNULA) IMPLANT
CANNULA SHOULDER 7CM (CANNULA) ×2 IMPLANT
CANNULA TWIST IN 8.25X7CM (CANNULA) IMPLANT
CLEANER CAUTERY TIP 5X5 PAD (MISCELLANEOUS) IMPLANT
DISSECTOR  3.8MM X 13CM (MISCELLANEOUS)
DISSECTOR 3.8MM X 13CM (MISCELLANEOUS) IMPLANT
DISSECTOR 4.0MM X 13CM (MISCELLANEOUS) IMPLANT
DRAPE STERI 35X30 U-POUCH (DRAPES) ×2 IMPLANT
DRAPE SURG 17X23 STRL (DRAPES) ×2 IMPLANT
DRAPE U-SHAPE 76X120 STRL (DRAPES) ×4 IMPLANT
DRESSING MEPILEX FLEX 4X4 (GAUZE/BANDAGES/DRESSINGS) IMPLANT
DRSG EMULSION OIL 3X3 NADH (GAUZE/BANDAGES/DRESSINGS) ×2 IMPLANT
DRSG MEPILEX BORDER 4X8 (GAUZE/BANDAGES/DRESSINGS) IMPLANT
DRSG MEPILEX FLEX 4X4 (GAUZE/BANDAGES/DRESSINGS)
DRSG PAD ABDOMINAL 8X10 ST (GAUZE/BANDAGES/DRESSINGS) ×2 IMPLANT
DURAPREP 26ML APPLICATOR (WOUND CARE) ×2 IMPLANT
ELECT REM PT RETURN 9FT ADLT (ELECTROSURGICAL) ×2
ELECTRODE REM PT RTRN 9FT ADLT (ELECTROSURGICAL) ×1 IMPLANT
FIBER TAPE 2MM (SUTURE) ×1 IMPLANT
GAUZE SPONGE 4X4 12PLY STRL (GAUZE/BANDAGES/DRESSINGS) ×2 IMPLANT
GLOVE BIO SURGEON STRL SZ7.5 (GLOVE) ×2 IMPLANT
GLOVE BIOGEL PI IND STRL 8 (GLOVE) ×2 IMPLANT
GLOVE BIOGEL PI INDICATOR 8 (GLOVE) ×2
GLOVE SURG ORTHO 8.0 STRL STRW (GLOVE) ×2 IMPLANT
GOWN STRL REUS W/ TWL LRG LVL3 (GOWN DISPOSABLE) ×1 IMPLANT
GOWN STRL REUS W/ TWL XL LVL3 (GOWN DISPOSABLE) ×1 IMPLANT
GOWN STRL REUS W/TWL LRG LVL3 (GOWN DISPOSABLE) ×2
GOWN STRL REUS W/TWL XL LVL3 (GOWN DISPOSABLE) ×4 IMPLANT
MANIFOLD NEPTUNE II (INSTRUMENTS) ×2 IMPLANT
NDL 1/2 CIR CATGUT .05X1.09 (NEEDLE) IMPLANT
NDL SCORPION MULTI FIRE (NEEDLE) IMPLANT
NEEDLE 1/2 CIR CATGUT .05X1.09 (NEEDLE) IMPLANT
NEEDLE SCORPION MULTI FIRE (NEEDLE) ×2 IMPLANT
NS IRRIG 1000ML POUR BTL (IV SOLUTION) ×2 IMPLANT
PACK ARTHROSCOPY DSU (CUSTOM PROCEDURE TRAY) ×2 IMPLANT
PACK BASIN DAY SURGERY FS (CUSTOM PROCEDURE TRAY) ×2 IMPLANT
PAD CLEANER CAUTERY TIP 5X5 (MISCELLANEOUS)
PAD ORTHO SHOULDER 7X19 LRG (SOFTGOODS) ×1 IMPLANT
PENCIL SMOKE EVACUATOR (MISCELLANEOUS) IMPLANT
PORT APPOLLO RF 90DEGREE MULTI (SURGICAL WAND) IMPLANT
RESTRAINT HEAD UNIVERSAL NS (MISCELLANEOUS) ×2 IMPLANT
SLEEVE SCD COMPRESS KNEE MED (STOCKING) ×2 IMPLANT
SLING ARM FOAM STRAP LRG (SOFTGOODS) IMPLANT
SLING ULTRA II MEDIUM (SOFTGOODS) IMPLANT
SLING ULTRA II SMALL (SOFTGOODS) IMPLANT
SPIKE FLUID TRANSFER (MISCELLANEOUS) IMPLANT
SPONGE T-LAP 4X18 ~~LOC~~+RFID (SPONGE) IMPLANT
STRIP CLOSURE SKIN 1/2X4 (GAUZE/BANDAGES/DRESSINGS) ×1 IMPLANT
SUCTION FRAZIER HANDLE 10FR (MISCELLANEOUS)
SUCTION TUBE FRAZIER 10FR DISP (MISCELLANEOUS) IMPLANT
SUT BONE WAX W31G (SUTURE) IMPLANT
SUT ETHILON 3 0 PS 1 (SUTURE) ×2 IMPLANT
SUT FIBERWIRE #2 38 T-5 BLUE (SUTURE)
SUT MNCRL AB 3-0 PS2 18 (SUTURE) IMPLANT
SUT TICRON 1 T 12 (SUTURE) ×1 IMPLANT
SUT TIGER TAPE 7 IN WHITE (SUTURE) ×1 IMPLANT
SUT VIC AB 0 CT1 27 (SUTURE)
SUT VIC AB 0 CT1 27XBRD ANBCTR (SUTURE) IMPLANT
SUT VIC AB 1 CT1 27 (SUTURE)
SUT VIC AB 1 CT1 27XBRD ANBCTR (SUTURE) IMPLANT
SUT VIC AB 2-0 SH 27 (SUTURE)
SUT VIC AB 2-0 SH 27XBRD (SUTURE) IMPLANT
SUTURE FIBERWR #2 38 T-5 BLUE (SUTURE) IMPLANT
TAPE FIBER 2MM 7IN #2 BLUE (SUTURE) IMPLANT
TOWEL GREEN STERILE FF (TOWEL DISPOSABLE) ×2 IMPLANT
TUBING ARTHROSCOPY IRRIG 16FT (MISCELLANEOUS) ×2 IMPLANT
WATER STERILE IRR 1000ML POUR (IV SOLUTION) ×2 IMPLANT
YANKAUER SUCT BULB TIP NO VENT (SUCTIONS) IMPLANT

## 2021-08-10 NOTE — Interval H&P Note (Signed)
History and Physical Interval Note: ? ?08/10/2021 ?8:32 AM ? ?Kristin Farley  has presented today for surgery, with the diagnosis of RIGHT SHOULDER ROTATOR CUFF TEAR.  The various methods of treatment have been discussed with the patient and family. After consideration of risks, benefits and other options for treatment, the patient has consented to  Procedure(s): ?SHOULDER ARTHROSCOPY WITH ACROMIOPLASTY, OPEN ROTATOR CUFF REPAIR AND OPEN DISTAL CLAVICLECTOMY POSSIBLE BICEPS TENOTOMY (Right) as a surgical intervention.  The patient's history has been reviewed, patient examined, no change in status, stable for surgery.  I have reviewed the patient's chart and labs.  Questions were answered to the patient's satisfaction.   ? ? ?W D Carloyn Manner ? ? ?

## 2021-08-10 NOTE — Progress Notes (Signed)
Assisted Dr. Rob Fitzgerald with right, interscalene , ultrasound guided block. Side rails up, monitors on throughout procedure. See vital signs in flow sheet. Tolerated Procedure well. 

## 2021-08-10 NOTE — Anesthesia Procedure Notes (Signed)
Procedure Name: Intubation ?Date/Time: 08/10/2021 8:44 AM ?Performed by: Lavonia Dana, CRNA ?Pre-anesthesia Checklist: Patient identified, Emergency Drugs available, Suction available and Patient being monitored ?Patient Re-evaluated:Patient Re-evaluated prior to induction ?Oxygen Delivery Method: Circle system utilized ?Preoxygenation: Pre-oxygenation with 100% oxygen ?Induction Type: IV induction ?Ventilation: Mask ventilation without difficulty and Oral airway inserted - appropriate to patient size ?Laryngoscope Size: Mac and 3 ?Grade View: Grade I ?Tube type: Oral ?Tube size: 7.0 mm ?Number of attempts: 1 ?Airway Equipment and Method: Stylet and Oral airway ?Placement Confirmation: ETT inserted through vocal cords under direct vision, positive ETCO2 and breath sounds checked- equal and bilateral ?Secured at: 22 cm ?Tube secured with: Tape ?Dental Injury: Teeth and Oropharynx as per pre-operative assessment  ? ? ? ? ?

## 2021-08-10 NOTE — Brief Op Note (Signed)
08/10/2021 ? ?10:03 AM ? ?PATIENT:  Kristin Farley  76 y.o. female ? ?PRE-OPERATIVE DIAGNOSIS:  RIGHT SHOULDER ROTATOR CUFF TEAR ? ?POST-OPERATIVE DIAGNOSIS:  RIGHT SHOULDER ROTATOR CUFF TEAR ? ?PROCEDURE:  Procedure(s): ?SHOULDER ARTHROSCOPY WITH ACROMIOPLASTY, OPEN ROTATOR CUFF REPAIR AND OPEN DISTAL CLAVICLECTOMY (Right) ? ?SURGEON:  Surgeon(s) and Role: ?   Earlie Server, MD - Primary ? ?PHYSICIAN ASSISTANT: Chriss Czar, PA-C ? ?ASSISTANTS:  ? ?ANESTHESIA:   regional and general ? ?EBL:  10 mL  ? ?BLOOD ADMINISTERED:none ? ?DRAINS: none  ? ?LOCAL MEDICATIONS USED:  NONE ? ?SPECIMEN:  No Specimen ? ?DISPOSITION OF SPECIMEN:  N/A ? ?COUNTS:  YES ? ?TOURNIQUET:  * No tourniquets in log * ? ?DICTATION: .Other Dictation: Dictation Number unknown ? ?PLAN OF CARE: Discharge to home after PACU ? ?PATIENT DISPOSITION:  PACU - hemodynamically stable. ?  ?Delay start of Pharmacological VTE agent (>24hrs) due to surgical blood loss or risk of bleeding: not applicable ? ?

## 2021-08-10 NOTE — Anesthesia Preprocedure Evaluation (Signed)
Anesthesia Evaluation  ?Patient identified by MRN, date of birth, ID band ?Patient awake ? ? ? ?Reviewed: ?Allergy & Precautions, NPO status , Patient's Chart, lab work & pertinent test results ? ?Airway ?Mallampati: II ? ?TM Distance: >3 FB ?Neck ROM: Full ? ? ? Dental ?  ?Pulmonary ?sleep apnea ,  ?  ?breath sounds clear to auscultation ? ? ? ? ? ? Cardiovascular ?hypertension, Pt. on medications and Pt. on home beta blockers ? ?Rhythm:Regular Rate:Normal ? ? ?  ?Neuro/Psych ? Headaches,   ? GI/Hepatic ?Neg liver ROS, GERD  ,  ?Endo/Other  ?diabetes, Type 2, Oral Hypoglycemic Agents ? Renal/GU ?negative Renal ROS  ? ?  ?Musculoskeletal ? ?(+) Arthritis ,  ? Abdominal ?  ?Peds ? Hematology ?negative hematology ROS ?(+)   ?Anesthesia Other Findings ? ? Reproductive/Obstetrics ? ?  ? ? ? ? ? ? ? ? ? ? ? ? ? ?  ?  ? ? ? ? ? ? ? ? ?Anesthesia Physical ?Anesthesia Plan ? ?ASA: 2 ? ?Anesthesia Plan: General  ? ?Post-op Pain Management: Regional block* and Tylenol PO (pre-op)*  ? ?Induction: Intravenous ? ?PONV Risk Score and Plan: 3 and Dexamethasone, Ondansetron, Midazolam and Treatment may vary due to age or medical condition ? ?Airway Management Planned: Oral ETT ? ?Additional Equipment: None ? ?Intra-op Plan:  ? ?Post-operative Plan: Extubation in OR ? ?Informed Consent: I have reviewed the patients History and Physical, chart, labs and discussed the procedure including the risks, benefits and alternatives for the proposed anesthesia with the patient or authorized representative who has indicated his/her understanding and acceptance.  ? ? ? ?Dental advisory given ? ?Plan Discussed with: CRNA ? ?Anesthesia Plan Comments:   ? ? ? ? ? ? ?Anesthesia Quick Evaluation ? ?

## 2021-08-10 NOTE — Anesthesia Procedure Notes (Signed)
Anesthesia Regional Block: Interscalene brachial plexus block  ? ?Pre-Anesthetic Checklist: , timeout performed,  Correct Patient, Correct Site, Correct Laterality,  Correct Procedure, Correct Position, site marked,  Risks and benefits discussed,  Surgical consent,  Pre-op evaluation,  At surgeon's request and post-op pain management ? ?Laterality: Right ? ?Prep: chloraprep     ?  ?Needles:  ?Injection technique: Single-shot ? ?Needle Type: Echogenic Stimulator Needle   ? ? ?Needle Length: 9cm  ?Needle Gauge: 21  ? ? ? ?Additional Needles: ? ? ?Procedures:, nerve stimulator,,, ultrasound used (permanent image in chart),,    ? ?Nerve Stimulator or Paresthesia:  ?Response: deltoid and biceps, 0.5 mA ? ?Additional Responses:  ? ?Narrative:  ?Start time: 08/10/2021 7:45 AM ?End time: 08/10/2021 7:52 AM ?Injection made incrementally with aspirations every 5 mL. ? ?Performed by: Personally  ?Anesthesiologist: Suzette Battiest, MD ? ? ? ? ?

## 2021-08-10 NOTE — Transfer of Care (Signed)
Immediate Anesthesia Transfer of Care Note ? ?Patient: Kristin Farley ? ?Procedure(s) Performed: SHOULDER ARTHROSCOPY WITH ACROMIOPLASTY, OPEN ROTATOR CUFF REPAIR AND OPEN DISTAL CLAVICLECTOMY (Right: Shoulder) ? ?Patient Location: PACU ? ?Anesthesia Type:GA combined with regional for post-op pain ? ?Level of Consciousness: drowsy ? ?Airway & Oxygen Therapy: Patient Spontanous Breathing and Patient connected to face mask oxygen ? ?Post-op Assessment: Report given to RN and Post -op Vital signs reviewed and stable ? ?Post vital signs: Reviewed and stable ? ?Last Vitals:  ?Vitals Value Taken Time  ?BP 154/84 08/10/21 1012  ?Temp    ?Pulse 79 08/10/21 1014  ?Resp 13 08/10/21 1014  ?SpO2 97 % 08/10/21 1014  ?Vitals shown include unvalidated device data. ? ?Last Pain:  ?Vitals:  ? 08/10/21 0656  ?TempSrc: Oral  ?PainSc: 2   ?   ? ?Patients Stated Pain Goal: 0 (08/10/21 3790) ? ?Complications: No notable events documented. ?

## 2021-08-10 NOTE — Discharge Instructions (Addendum)
Diet: As you were doing prior to hospitalization  ? ?Activity: Increase activity slowly as tolerated  ?No lifting or driving for 6 weeks  ? ?Shower: May shower without a dressing on post op day #5, NO SOAKING in tub  ? ?Dressing: You may change your dressing on post op day #3.  ?Then change the dressing daily with sterile 4"x4"s gauze dressing  ?Or large band aid ? ?Weight Bearing: nonweight bearing right arm.  Remain in sling except shower keeping arm close to body. ? ?To prevent constipation: you may use a stool softener such as -  ?Colace ( over the counter) 100 mg by mouth twice a day  ?Drink plenty of fluids ( prune juice may be helpful) and high fiber foods  ?Miralax ( over the counter) for constipation as needed.  ? ?Precautions: If you experience chest pain or shortness of breath - call 911 immediately For transfer to the hospital emergency department!!  ?If you develop a fever greater that 101 F, purulent drainage from wound, increased redness or drainage from wound, or calf pain -- Call the office  ? ?Follow- Up Appointment: Please call for an appointment to be seen in 2 weeks  ?[place] - [phone]  ? ? ?May take Tylenol after 1pm, if needed.  ? ? ?Post Anesthesia Home Care Instructions ? ?Activity: ?Get plenty of rest for the remainder of the day. A responsible individual must stay with you for 24 hours following the procedure.  ?For the next 24 hours, DO NOT: ?-Drive a car ?-Advertising copywriter ?-Drink alcoholic beverages ?-Take any medication unless instructed by your physician ?-Make any legal decisions or sign important papers. ? ?Meals: ?Start with liquid foods such as gelatin or soup. Progress to regular foods as tolerated. Avoid greasy, spicy, heavy foods. If nausea and/or vomiting occur, drink only clear liquids until the nausea and/or vomiting subsides. Call your physician if vomiting continues. ? ?Special Instructions/Symptoms: ?Your throat may feel dry or sore from the anesthesia or the  breathing tube placed in your throat during surgery. If this causes discomfort, gargle with warm salt water. The discomfort should disappear within 24 hours. ? ?If you had a scopolamine patch placed behind your ear for the management of post- operative nausea and/or vomiting: ? ?1. The medication in the patch is effective for 72 hours, after which it should be removed.  Wrap patch in a tissue and discard in the trash. Wash hands thoroughly with soap and water. ?2. You may remove the patch earlier than 72 hours if you experience unpleasant side effects which may include dry mouth, dizziness or visual disturbances. ?3. Avoid touching the patch. Wash your hands with soap and water after contact with the patch. ?   Regional Anesthesia Blocks ? ?1. Numbness or the inability to move the "blocked" extremity may last from 3-48 hours after placement. The length of time depends on the medication injected and your individual response to the medication. If the numbness is not going away after 48 hours, call your surgeon. ? ?2. The extremity that is blocked will need to be protected until the numbness is gone and the  Strength has returned. Because you cannot feel it, you will need to take extra care to avoid injury. Because it may be weak, you may have difficulty moving it or using it. You may not know what position it is in without looking at it while the block is in effect. ? ?3. For blocks in the legs and feet, returning to  weight bearing and walking needs to be done carefully. You will need to wait until the numbness is entirely gone and the strength has returned. You should be able to move your leg and foot normally before you try and bear weight or walk. You will need someone to be with you when you first try to ensure you do not fall and possibly risk injury. ? ?4. Bruising and tenderness at the needle site are common side effects and will resolve in a few days. ? ?5. Persistent numbness or new problems with movement  should be communicated to the surgeon or the [facility] [phone] [facility] [phone]). Information for Discharge Teaching: ?EXPAREL (bupivacaine liposome injectable suspension)  ? ?Your surgeon or anesthesiologist gave you EXPAREL(bupivacaine) to help control your pain after surgery.  ?EXPAREL is a local anesthetic that provides pain relief by numbing the tissue around the surgical site. ?EXPAREL is designed to release pain medication over time and can control pain for up to 72 hours. ?Depending on how you respond to EXPAREL, you may require less pain medication during your recovery. ? ?Possible side effects: ?Temporary loss of sensation or ability to move in the area where bupivacaine was injected. ?Nausea, vomiting, constipation ?Rarely, numbness and tingling in your mouth or lips, lightheadedness, or anxiety may occur. ?Call your doctor right away if you think you may be experiencing any of these sensations, or if you have other questions regarding possible side effects. ? ?Follow all other discharge instructions given to you by your surgeon or nurse. Eat a healthy diet and drink plenty of water or other fluids. ? ?If you return to the hospital for any reason within 96 hours following the administration of EXPAREL, it is important for health care providers to know that you have received this anesthetic. A teal colored band has been placed on your arm with the date, time and amount of EXPAREL you have received in order to alert and inform your health care providers. Please leave this armband in place for the full 96 hours following administration, and then you may remove the band. DeRoyal Abductor sling instructions and diagram: (Blue ball) ? ?Youtube video: https://youtu.be/[email or web address] ? ?Please contact your surgeon's office if you have any questions about this shoulder immobilizer. ? ? ? ? ? ?  ? ? ?

## 2021-08-10 NOTE — Op Note (Signed)
NAME: Kristin Farley, Kristin Farley. ?MEDICAL RECORD NO: [id number] ?ACCOUNT NO: [id number] ?DATE OF BIRTH: 01-20-1946 ?FACILITY: [facility] ?LOCATION: [facility] ?PHYSICIAN: W D. Carloyn Manner., MD ? ?Operative Report  ? ?DATE OF PROCEDURE: 08/10/2021 ? ?PREOPERATIVE DIAGNOSES:   ?1.  Complete retracted tear, supraspinatus tendon. ?2.  Degenerative tearing of the anterior superior labrum. ?3.  Impingement. ?4.  Acromioclavicular arthritis. ? ?POSTOPERATIVE DIAGNOSES:   ?1.  Complete retracted tear, supraspinatus tendon. ?2.  Degenerative tearing of the anterior superior labrum. ?3.  Impingement. ?4.  Acromioclavicular arthritis. ? ?OPERATIONS: ?1.  Open rotator cuff repair, acromioplasty. ?2. Open distal clavicle excision. ?3.  Arthroscopic debridement, torn labrum. ? ?SURGEON:  W D. Carloyn Manner., MD ? ?ASSISTANTVincent Peyer, PA. ? ?ANESTHESIA:  General with block. ? ?DESCRIPTION OF PROCEDURE:  Beach chair position with posterior and anterior portals made for arthroscopic evaluation. Intraarticularly, she was noted to have moderate synovitis around the tear.  This was debrided, degenerative tearing of the anterior  ?superior labrum was debrided as well.  The biceps tendon anchor was intact.  Complete large full thickness retracted tear of the supraspinatus was noted.  Intra-articular debridement was carried out with the arthroscope of the synovium as well as the  ?labrum. ? ?We then converted the procedure to an open procedure with an incision, bisecting the acromial AC interval.  We split the deltoid about 2-3 cm distal to the acromion.  We performed a distal clavicle excision for arthritic distal clavicle excising about a  ?centimeter of the distal clavicle.  The acromion was prominent with evidence of impingement.  We released the CA ligament and performed an acromioplasty.  Bursectomy was carried out.  Tear was identified and mobilized.  We freshened the edges of the tear ? with a 15 blade and bur the tuberosity.  We then elected to  fix this with a double-armed 4.75 Arthrex SwiveLock, placed a medial row anchor, placing 4 strands of the suture material through the cuff with an additional strand of the suture.  We then  ?fixed this through an additional SwiveLock on the lateral row essentially creating a watertight repair, although the repair was under some tension.  The deltoid split was closed with Ethibond, the subcutaneous tissues with Vicryl and the skin with  ?Monocryl.  Taken to the recovery room in stable condition. ? ? ?PUS ?D: 08/10/2021 9:52:26 am T: 08/10/2021 12:36:00 pm  ?JOB: [id number]/ [id number]  ?

## 2021-08-10 NOTE — Anesthesia Postprocedure Evaluation (Signed)
Anesthesia Post Note ? ?Patient: Kristin Farley ? ?Procedure(s) Performed: SHOULDER ARTHROSCOPY WITH ACROMIOPLASTY, OPEN ROTATOR CUFF REPAIR AND OPEN DISTAL CLAVICLECTOMY (Right: Shoulder) ? ?  ? ?Patient location during evaluation: PACU ?Anesthesia Type: General ?Level of consciousness: awake and alert ?Pain management: pain level controlled ?Vital Signs Assessment: post-procedure vital signs reviewed and stable ?Respiratory status: spontaneous breathing, nonlabored ventilation, respiratory function stable and patient connected to nasal cannula oxygen ?Cardiovascular status: blood pressure returned to baseline and stable ?Postop Assessment: no apparent nausea or vomiting ?Anesthetic complications: no ? ? ?No notable events documented. ? ?Last Vitals:  ?Vitals:  ? 08/10/21 1045 08/10/21 1118  ?BP: 129/73 (!) 144/87  ?Pulse: 75 84  ?Resp: 15 16  ?Temp:  36.6 ?C  ?SpO2: 93% 94%  ?  ?Last Pain:  ?Vitals:  ? 08/10/21 1118  ?TempSrc:   ?PainSc: 0-No pain  ? ? ?  ?  ?  ?  ?  ?  ? ?Marcene Duos E ? ? ? ? ?

## 2021-08-15 ENCOUNTER — Encounter (HOSPITAL_BASED_OUTPATIENT_CLINIC_OR_DEPARTMENT_OTHER): Payer: Self-pay | Admitting: Orthopedic Surgery

## 2021-08-19 DIAGNOSIS — S46011D Strain of muscle(s) and tendon(s) of the rotator cuff of right shoulder, subsequent encounter: Secondary | ICD-10-CM | POA: Diagnosis not present

## 2021-09-06 DIAGNOSIS — E119 Type 2 diabetes mellitus without complications: Secondary | ICD-10-CM | POA: Diagnosis not present

## 2021-09-09 DIAGNOSIS — S46011D Strain of muscle(s) and tendon(s) of the rotator cuff of right shoulder, subsequent encounter: Secondary | ICD-10-CM | POA: Diagnosis not present

## 2021-09-19 DIAGNOSIS — E1140 Type 2 diabetes mellitus with diabetic neuropathy, unspecified: Secondary | ICD-10-CM | POA: Diagnosis not present

## 2021-09-20 DIAGNOSIS — N390 Urinary tract infection, site not specified: Secondary | ICD-10-CM | POA: Diagnosis not present

## 2021-09-20 DIAGNOSIS — Z299 Encounter for prophylactic measures, unspecified: Secondary | ICD-10-CM | POA: Diagnosis not present

## 2021-09-20 DIAGNOSIS — I10 Essential (primary) hypertension: Secondary | ICD-10-CM | POA: Diagnosis not present

## 2021-09-20 DIAGNOSIS — M75101 Unspecified rotator cuff tear or rupture of right shoulder, not specified as traumatic: Secondary | ICD-10-CM | POA: Diagnosis not present

## 2021-09-20 DIAGNOSIS — Z789 Other specified health status: Secondary | ICD-10-CM | POA: Diagnosis not present

## 2021-09-20 DIAGNOSIS — E1165 Type 2 diabetes mellitus with hyperglycemia: Secondary | ICD-10-CM | POA: Diagnosis not present

## 2021-09-23 DIAGNOSIS — M75101 Unspecified rotator cuff tear or rupture of right shoulder, not specified as traumatic: Secondary | ICD-10-CM | POA: Diagnosis not present

## 2021-09-27 DIAGNOSIS — M75101 Unspecified rotator cuff tear or rupture of right shoulder, not specified as traumatic: Secondary | ICD-10-CM | POA: Diagnosis not present

## 2021-09-28 DIAGNOSIS — M75101 Unspecified rotator cuff tear or rupture of right shoulder, not specified as traumatic: Secondary | ICD-10-CM | POA: Diagnosis not present

## 2021-10-05 DIAGNOSIS — M75101 Unspecified rotator cuff tear or rupture of right shoulder, not specified as traumatic: Secondary | ICD-10-CM | POA: Diagnosis not present

## 2021-10-06 DIAGNOSIS — M25511 Pain in right shoulder: Secondary | ICD-10-CM | POA: Diagnosis not present

## 2021-10-06 DIAGNOSIS — I10 Essential (primary) hypertension: Secondary | ICD-10-CM | POA: Diagnosis not present

## 2021-10-06 DIAGNOSIS — M75101 Unspecified rotator cuff tear or rupture of right shoulder, not specified as traumatic: Secondary | ICD-10-CM | POA: Diagnosis not present

## 2021-10-06 DIAGNOSIS — E119 Type 2 diabetes mellitus without complications: Secondary | ICD-10-CM | POA: Diagnosis not present

## 2021-10-06 DIAGNOSIS — E1165 Type 2 diabetes mellitus with hyperglycemia: Secondary | ICD-10-CM | POA: Diagnosis not present

## 2021-10-06 DIAGNOSIS — Z299 Encounter for prophylactic measures, unspecified: Secondary | ICD-10-CM | POA: Diagnosis not present

## 2021-10-12 DIAGNOSIS — M75101 Unspecified rotator cuff tear or rupture of right shoulder, not specified as traumatic: Secondary | ICD-10-CM | POA: Diagnosis not present

## 2021-10-13 DIAGNOSIS — M75101 Unspecified rotator cuff tear or rupture of right shoulder, not specified as traumatic: Secondary | ICD-10-CM | POA: Diagnosis not present

## 2021-10-14 DIAGNOSIS — S46011D Strain of muscle(s) and tendon(s) of the rotator cuff of right shoulder, subsequent encounter: Secondary | ICD-10-CM | POA: Diagnosis not present

## 2021-10-20 DIAGNOSIS — M75101 Unspecified rotator cuff tear or rupture of right shoulder, not specified as traumatic: Secondary | ICD-10-CM | POA: Diagnosis not present

## 2021-10-25 DIAGNOSIS — M75101 Unspecified rotator cuff tear or rupture of right shoulder, not specified as traumatic: Secondary | ICD-10-CM | POA: Diagnosis not present

## 2021-11-01 DIAGNOSIS — M75101 Unspecified rotator cuff tear or rupture of right shoulder, not specified as traumatic: Secondary | ICD-10-CM | POA: Diagnosis not present

## 2021-11-07 DIAGNOSIS — E119 Type 2 diabetes mellitus without complications: Secondary | ICD-10-CM | POA: Diagnosis not present

## 2021-11-09 DIAGNOSIS — M75101 Unspecified rotator cuff tear or rupture of right shoulder, not specified as traumatic: Secondary | ICD-10-CM | POA: Diagnosis not present

## 2021-11-15 DIAGNOSIS — I10 Essential (primary) hypertension: Secondary | ICD-10-CM | POA: Diagnosis not present

## 2021-11-15 DIAGNOSIS — Z7189 Other specified counseling: Secondary | ICD-10-CM | POA: Diagnosis not present

## 2021-11-15 DIAGNOSIS — Z0000 Encounter for general adult medical examination without abnormal findings: Secondary | ICD-10-CM | POA: Diagnosis not present

## 2021-11-15 DIAGNOSIS — Z299 Encounter for prophylactic measures, unspecified: Secondary | ICD-10-CM | POA: Diagnosis not present

## 2021-11-15 DIAGNOSIS — R5383 Other fatigue: Secondary | ICD-10-CM | POA: Diagnosis not present

## 2021-11-15 DIAGNOSIS — E7800 Pure hypercholesterolemia, unspecified: Secondary | ICD-10-CM | POA: Diagnosis not present

## 2021-11-15 DIAGNOSIS — Z79899 Other long term (current) drug therapy: Secondary | ICD-10-CM | POA: Diagnosis not present

## 2021-11-15 DIAGNOSIS — M1710 Unilateral primary osteoarthritis, unspecified knee: Secondary | ICD-10-CM | POA: Diagnosis not present

## 2021-11-15 DIAGNOSIS — Z1211 Encounter for screening for malignant neoplasm of colon: Secondary | ICD-10-CM | POA: Diagnosis not present

## 2021-11-18 DIAGNOSIS — S46011D Strain of muscle(s) and tendon(s) of the rotator cuff of right shoulder, subsequent encounter: Secondary | ICD-10-CM | POA: Diagnosis not present

## 2021-12-06 DIAGNOSIS — E1140 Type 2 diabetes mellitus with diabetic neuropathy, unspecified: Secondary | ICD-10-CM | POA: Diagnosis not present

## 2021-12-07 DIAGNOSIS — E119 Type 2 diabetes mellitus without complications: Secondary | ICD-10-CM | POA: Diagnosis not present

## 2021-12-22 DIAGNOSIS — Z1231 Encounter for screening mammogram for malignant neoplasm of breast: Secondary | ICD-10-CM | POA: Diagnosis not present

## 2022-01-06 DIAGNOSIS — E119 Type 2 diabetes mellitus without complications: Secondary | ICD-10-CM | POA: Diagnosis not present

## 2022-02-06 DIAGNOSIS — E119 Type 2 diabetes mellitus without complications: Secondary | ICD-10-CM | POA: Diagnosis not present

## 2022-02-07 DIAGNOSIS — E1165 Type 2 diabetes mellitus with hyperglycemia: Secondary | ICD-10-CM | POA: Diagnosis not present

## 2022-02-07 DIAGNOSIS — I10 Essential (primary) hypertension: Secondary | ICD-10-CM | POA: Diagnosis not present

## 2022-02-07 DIAGNOSIS — K219 Gastro-esophageal reflux disease without esophagitis: Secondary | ICD-10-CM | POA: Diagnosis not present

## 2022-02-07 DIAGNOSIS — M1990 Unspecified osteoarthritis, unspecified site: Secondary | ICD-10-CM | POA: Diagnosis not present

## 2022-02-07 DIAGNOSIS — Z299 Encounter for prophylactic measures, unspecified: Secondary | ICD-10-CM | POA: Diagnosis not present

## 2022-02-28 DIAGNOSIS — E1140 Type 2 diabetes mellitus with diabetic neuropathy, unspecified: Secondary | ICD-10-CM | POA: Diagnosis not present

## 2022-03-08 DIAGNOSIS — E119 Type 2 diabetes mellitus without complications: Secondary | ICD-10-CM | POA: Diagnosis not present

## 2022-03-10 DIAGNOSIS — M25561 Pain in right knee: Secondary | ICD-10-CM | POA: Diagnosis not present

## 2022-03-10 DIAGNOSIS — Z299 Encounter for prophylactic measures, unspecified: Secondary | ICD-10-CM | POA: Diagnosis not present

## 2022-03-10 DIAGNOSIS — Z713 Dietary counseling and surveillance: Secondary | ICD-10-CM | POA: Diagnosis not present

## 2022-03-10 DIAGNOSIS — I10 Essential (primary) hypertension: Secondary | ICD-10-CM | POA: Diagnosis not present

## 2022-04-07 DIAGNOSIS — E119 Type 2 diabetes mellitus without complications: Secondary | ICD-10-CM | POA: Diagnosis not present

## 2022-04-18 DIAGNOSIS — H35033 Hypertensive retinopathy, bilateral: Secondary | ICD-10-CM | POA: Diagnosis not present

## 2022-05-08 DIAGNOSIS — I152 Hypertension secondary to endocrine disorders: Secondary | ICD-10-CM | POA: Diagnosis not present

## 2022-05-08 DIAGNOSIS — E1159 Type 2 diabetes mellitus with other circulatory complications: Secondary | ICD-10-CM | POA: Diagnosis not present

## 2022-05-08 DIAGNOSIS — M545 Low back pain, unspecified: Secondary | ICD-10-CM | POA: Diagnosis not present

## 2022-05-08 DIAGNOSIS — Z299 Encounter for prophylactic measures, unspecified: Secondary | ICD-10-CM | POA: Diagnosis not present

## 2022-05-08 DIAGNOSIS — I10 Essential (primary) hypertension: Secondary | ICD-10-CM | POA: Diagnosis not present

## 2022-05-08 DIAGNOSIS — E119 Type 2 diabetes mellitus without complications: Secondary | ICD-10-CM | POA: Diagnosis not present

## 2022-05-09 DIAGNOSIS — E1140 Type 2 diabetes mellitus with diabetic neuropathy, unspecified: Secondary | ICD-10-CM | POA: Diagnosis not present

## 2022-05-16 DIAGNOSIS — G4733 Obstructive sleep apnea (adult) (pediatric): Secondary | ICD-10-CM | POA: Diagnosis not present

## 2022-05-16 DIAGNOSIS — R350 Frequency of micturition: Secondary | ICD-10-CM | POA: Diagnosis not present

## 2022-05-16 DIAGNOSIS — Z299 Encounter for prophylactic measures, unspecified: Secondary | ICD-10-CM | POA: Diagnosis not present

## 2022-05-16 DIAGNOSIS — Z7189 Other specified counseling: Secondary | ICD-10-CM | POA: Diagnosis not present

## 2022-05-16 DIAGNOSIS — I10 Essential (primary) hypertension: Secondary | ICD-10-CM | POA: Diagnosis not present

## 2022-05-16 DIAGNOSIS — Z0000 Encounter for general adult medical examination without abnormal findings: Secondary | ICD-10-CM | POA: Diagnosis not present

## 2022-05-16 DIAGNOSIS — E1165 Type 2 diabetes mellitus with hyperglycemia: Secondary | ICD-10-CM | POA: Diagnosis not present

## 2022-06-07 DIAGNOSIS — I10 Essential (primary) hypertension: Secondary | ICD-10-CM | POA: Diagnosis not present

## 2022-06-07 DIAGNOSIS — E119 Type 2 diabetes mellitus without complications: Secondary | ICD-10-CM | POA: Diagnosis not present

## 2022-06-07 DIAGNOSIS — M533 Sacrococcygeal disorders, not elsewhere classified: Secondary | ICD-10-CM | POA: Diagnosis not present

## 2022-06-07 DIAGNOSIS — E1165 Type 2 diabetes mellitus with hyperglycemia: Secondary | ICD-10-CM | POA: Diagnosis not present

## 2022-06-07 DIAGNOSIS — Z299 Encounter for prophylactic measures, unspecified: Secondary | ICD-10-CM | POA: Diagnosis not present

## 2022-06-08 DIAGNOSIS — J069 Acute upper respiratory infection, unspecified: Secondary | ICD-10-CM | POA: Diagnosis not present

## 2022-06-08 DIAGNOSIS — R5383 Other fatigue: Secondary | ICD-10-CM | POA: Diagnosis not present

## 2022-07-08 DIAGNOSIS — E119 Type 2 diabetes mellitus without complications: Secondary | ICD-10-CM | POA: Diagnosis not present

## 2022-07-20 DIAGNOSIS — Z299 Encounter for prophylactic measures, unspecified: Secondary | ICD-10-CM | POA: Diagnosis not present

## 2022-07-20 DIAGNOSIS — R198 Other specified symptoms and signs involving the digestive system and abdomen: Secondary | ICD-10-CM | POA: Diagnosis not present

## 2022-07-20 DIAGNOSIS — R1031 Right lower quadrant pain: Secondary | ICD-10-CM | POA: Diagnosis not present

## 2022-07-20 DIAGNOSIS — I10 Essential (primary) hypertension: Secondary | ICD-10-CM | POA: Diagnosis not present

## 2022-07-20 DIAGNOSIS — E1165 Type 2 diabetes mellitus with hyperglycemia: Secondary | ICD-10-CM | POA: Diagnosis not present

## 2022-07-24 DIAGNOSIS — E1140 Type 2 diabetes mellitus with diabetic neuropathy, unspecified: Secondary | ICD-10-CM | POA: Diagnosis not present

## 2022-08-01 ENCOUNTER — Encounter: Payer: Self-pay | Admitting: Internal Medicine

## 2022-08-08 DIAGNOSIS — E119 Type 2 diabetes mellitus without complications: Secondary | ICD-10-CM | POA: Diagnosis not present

## 2022-08-15 ENCOUNTER — Ambulatory Visit: Payer: Medicare Other | Admitting: Gastroenterology

## 2022-08-21 NOTE — Progress Notes (Deleted)
EGD 06/2018 with mild gastritis, gastric polyps (hyperplastic polyp), benign appearing esophageal stricture s/p dilation. Gastric biopsies without h.pylori, duodenal biopsies benign. No celiac.    H/O incomplete colonoscopy 03/2012 by Dr. Romie Levee. Scope could not be passed beyond transverse colon due to significant looping and tortuous colon. Follow up barium enema was normal.

## 2022-08-22 ENCOUNTER — Encounter: Payer: Self-pay | Admitting: Gastroenterology

## 2022-08-22 ENCOUNTER — Ambulatory Visit: Payer: Medicare Other | Admitting: Gastroenterology

## 2022-09-08 DIAGNOSIS — E119 Type 2 diabetes mellitus without complications: Secondary | ICD-10-CM | POA: Diagnosis not present

## 2022-09-12 ENCOUNTER — Telehealth: Payer: Self-pay | Admitting: *Deleted

## 2022-09-12 ENCOUNTER — Other Ambulatory Visit: Payer: Self-pay | Admitting: *Deleted

## 2022-09-12 ENCOUNTER — Other Ambulatory Visit: Payer: Self-pay | Admitting: Gastroenterology

## 2022-09-12 ENCOUNTER — Encounter: Payer: Self-pay | Admitting: Gastroenterology

## 2022-09-12 ENCOUNTER — Encounter: Payer: Self-pay | Admitting: *Deleted

## 2022-09-12 ENCOUNTER — Ambulatory Visit (INDEPENDENT_AMBULATORY_CARE_PROVIDER_SITE_OTHER): Payer: Medicare Other | Admitting: Gastroenterology

## 2022-09-12 VITALS — BP 113/71 | HR 105 | Temp 97.1°F | Ht 62.0 in | Wt 183.7 lb

## 2022-09-12 DIAGNOSIS — D649 Anemia, unspecified: Secondary | ICD-10-CM

## 2022-09-12 DIAGNOSIS — K219 Gastro-esophageal reflux disease without esophagitis: Secondary | ICD-10-CM

## 2022-09-12 DIAGNOSIS — Z1211 Encounter for screening for malignant neoplasm of colon: Secondary | ICD-10-CM | POA: Diagnosis not present

## 2022-09-12 DIAGNOSIS — R197 Diarrhea, unspecified: Secondary | ICD-10-CM

## 2022-09-12 MED ORDER — PEG 3350-KCL-NA BICARB-NACL 420 G PO SOLR: 4000.0000 mL | Freq: Once | ORAL | 0 refills | Status: AC

## 2022-09-12 MED ORDER — FAMOTIDINE 40 MG PO TABS: 40.0000 mg | ORAL_TABLET | Freq: Every day | ORAL | 1 refills | Status: DC

## 2022-09-12 NOTE — Progress Notes (Signed)
GI Office Note    Referring Provider: [name], MD Primary Care Physician:  [name], MD  Primary Gastroenterologist: Hennie Duos. Marletta Lor, DO (previously Dr. Darrick Penna)  Chief Complaint   Chief Complaint  Patient presents with   New Patient (Initial Visit)    Patient here today due to a referral from pcp Dr. Sherryll Burger. Patient says she is in need of a TCS. Patient says she has had a tcs in the past in [place], but does not remember whom done it nor the year. Patient did have a Egd done by [employer] on 03/202/2024.   History of Present Illness   Kristin Farley is a 77 y.o. female presenting today at the request of [name], MD for consult for colonoscopy.   Colonoscopy in 2013:  -no masses on DRE -unable to get through past the transverse colon  EGD 06/28/2018: -Benign-appearing esophageal stricture due to GERD -Mild gastritis secondary to NSAIDs -Multiple gastric polyps, removed.  -Advised diabetic diet and if no evidence of celiac or H. pylori she will need antibiotics for SIBO -path: normal   Today: Patient reports she had TCS in [place] in the past but unsure when or who done it.   Has GERD - has trouble with onions. She is on metformin and thinks that is what has been causing her diarrhea. Takes Mylanta with honey for reflux.  Just stopped omeprazole and started on lansoprazole. Taking it twice daily. No dysphagia. No daily NSAIDs. She takes tylenol pm at times to help her sleep and only takes 1 of those. No N/V. Does not take diclofenac anymore. Takes instaflex to help with her joint pain. No alcohol use. No tobacco use.   Does have diarrhea but thinks this is due to the metformin. Diarrhea does not occur everyday. She never knows when it is going to mess with her stomach. Tries not to eat anything heavy after 8pm.  Does admit to taking a stool softener.  Denies any overt constipation.  Current Outpatient Medications  Medication Sig Dispense Refill    acetaminophen (TYLENOL) 650 MG CR tablet Take 650 mg by mouth every 8 (eight) hours as needed for pain.     Alum & Mag Hydroxide-Simeth (MYLANTA PO) Take 15-30 mLs by mouth 2 (two) times daily as needed (upset stomach).      aspirin EC 81 MG tablet Take 81 mg by mouth daily.     atenolol (TENORMIN) 25 MG tablet Take 25 mg by mouth at bedtime.      atorvastatin (LIPITOR) 20 MG tablet Take 20 mg by mouth every evening.      benazepril (LOTENSIN) 5 MG tablet Take 5 mg by mouth daily.      Cholecalciferol (VITAMIN D) 2000 units tablet Take 2,000 Units by mouth daily.     docusate sodium (COLACE) 100 MG capsule Take 100 mg by mouth at bedtime.     l-methylfolate-B6-B12 (METANX) 3-35-2 MG TABS tablet Take 1 tablet by mouth daily.     metFORMIN (GLUCOPHAGE) 500 MG tablet Take 1,000 mg by mouth 2 (two) times daily with a meal.     mirabegron ER (MYRBETRIQ) 50 MG TB24 tablet Take 50 mg by mouth daily.     omeprazole (PRILOSEC) 20 MG capsule 1 PO 30 mins prior to breakfast and supper 60 capsule 11   OVER THE COUNTER MEDICATION daily at 6 (six) AM. Insta Flex for joints daily     No current facility-administered medications for this visit.  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    Urgency of urination     Past Surgical History:  Procedure Laterality Date   BIOPSY  06/28/2018   Procedure: BIOPSY;  Surgeon: West Bali, MD;  Location: [facility];   Service: Endoscopy;;   CATARACT EXTRACTION W/ INTRAOCULAR LENS  IMPLANT, BILATERAL     COLONOSCOPY  03/22/2012    Romie Levee, MD-NL BUT INCOMPLETE   DILATION AND CURETTAGE OF UTERUS     ESOPHAGOGASTRODUODENOSCOPY N/A 06/28/2018   Procedure: ESOPHAGOGASTRODUODENOSCOPY (EGD);  Surgeon: West Bali, MD;  Location: [facility];  Service: Endoscopy;  Laterality: N/A;  12:00pm   JOINT REPLACEMENT     KNEE ARTHROSCOPY Left 01/2017   PUBOVAGINAL SLING  08/25/2011   Procedure: Leonides Grills;  Surgeon: Kathi Ludwig, MD;  Location: [facility];  Service: Urology;  Laterality: N/A;  BOSTON SCIENTIFIC MESH UPHOLD LITE ANTERIOR VAULT REPAIR    SHOULDER ARTHROSCOPY WITH OPEN ROTATOR CUFF REPAIR AND DISTAL CLAVICLE ACROMINECTOMY Right 08/10/2021   Procedure: SHOULDER ARTHROSCOPY WITH ACROMIOPLASTY, OPEN ROTATOR CUFF REPAIR AND OPEN DISTAL CLAVICLECTOMY;  Surgeon: Frederico Hamman, MD;  Location: [facility];  Service: Orthopedics;  Laterality: Right;   TONSILLECTOMY AND ADENOIDECTOMY  AGE 36   TOTAL ABDOMINAL HYSTERECTOMY  1990   w/BSO   TOTAL KNEE ARTHROPLASTY Left 11/02/2017   TOTAL KNEE ARTHROPLASTY Left 11/02/2017   Procedure: LEFT TOTAL KNEE ARTHROPLASTY;  Surgeon: Frederico Hamman, MD;  Location: [facility];  Service: Orthopedics;  Laterality: Left;    Family History  Problem Relation Age of Onset   Cancer Mother        lung   Alcohol abuse Father    Colon cancer Neg Hx    Colon polyps Neg Hx     Allergies as of 09/12/2022 - Review Complete 09/12/2022  Allergen Reaction Noted   Elemental sulfur Nausea And Vomiting 10/17/2017    Social History   Socioeconomic History   Marital status: Divorced    Spouse name: Not on file   Number of children: Not on file   Years of education: Not on file   Highest education level: Not on file  Occupational History   Not on file  Tobacco Use   Smoking status: Never   Smokeless tobacco: Never  Vaping Use    Vaping Use: Never used  Substance and Sexual Activity   Alcohol use: No   Drug use: No   Sexual activity: Not Currently  Other Topics Concern   Not on file  Social History Narrative   Not on file   Social Determinants of Health   Financial Resource Strain: Not on file  Food Insecurity: Not on file  Transportation Needs: Not on file  Physical Activity: Not on file  Stress: Not on file  Social Connections:  Not on file  Intimate Partner Violence: Not on file     Review of Systems   Gen: Denies any fever, chills, fatigue, weight loss, lack of appetite.  CV: Denies chest pain, heart palpitations, peripheral edema, syncope.  Resp: Denies shortness of breath at rest or with exertion. Denies wheezing or cough.  GI: see HPI GU : Denies urinary burning, urinary frequency, urinary hesitancy MS: Denies joint pain, muscle weakness, cramps, or limitation of movement.  Derm: Denies rash, itching, dry skin Psych: Denies depression, anxiety, memory loss, and confusion Heme: Denies bruising, bleeding, and enlarged lymph nodes.   Physical Exam   BP 113/71 (BP Location: Left Arm, Patient Position: Sitting, Cuff Size: Large)   Pulse (!) 105   Temp (!) 97.1 F (36.2 C) (Temporal)   Ht 5\' 2"  (1.575 m)   Wt 183 lb 11.2 oz (83.3 kg)   BMI 33.60 kg/m   General:   Alert and oriented. Pleasant and cooperative. Well-nourished and well-developed.  Head:  Normocephalic and atraumatic. Eyes:  Without icterus, sclera clear and conjunctiva pink.  Ears:  Normal auditory acuity. Mouth:  No deformity or lesions, oral mucosa pink.  Lungs:  Clear to auscultation bilaterally. No wheezes, rales, or rhonchi. No distress.  Heart:  S1, S2 present without murmurs appreciated.  Abdomen:  +BS, soft, non-tender and non-distended. No HSM noted. No guarding or rebound. No masses appreciated.  Rectal:  Deferred  Msk:  Symmetrical without gross deformities. Normal posture. Extremities:  Without  edema. Neurologic:  Alert and  oriented x4;  grossly normal neurologically. Skin:  Intact without significant lesions or rashes. Psych:  Alert and cooperative. Normal mood and affect.   Assessment   LAYNI PELTZ is a 77 y.o. female with a history of anxiety, GERD, HTN, HLD, migraines, OSA with CPAP, type 2 diabetes, mitral valve prolapse presenting today with complaint of GERD, diarrhea, and need for colonoscopy.   Change in bowel habits, diarrhea: Having occasional diarrhea which she feels is related to metformin or diet. Does lactaid mild as whole milk causes diarrhea and upsets her stomach.  It is less likely that metformin is causing her diarrhea.  Given her history of tortuous colon on prior colonoscopy her symptoms are much more consistent with constipation with some overflow diarrhea.  She should continue to take daily stool softener.  Will complete colonoscopy as stated below for screening for colon cancer.  GERD: Stopped omeprazole and recently started on lansoprazole and has been taking that 30 mg twice daily. Still having some burning and using Mylanta as needed.  Recommend continuing PPI twice daily and add famotidine 40 mg nightly.  Denies any nausea, vomiting, lack of appetite, or dysphagia.  Screening for colon cancer: Last colonoscopy in 2013 which was incomplete as provider was unable to reach the ascending colon and part of the transverse colon.  Colon described as tortuous.  Anemia: Last documented hemoglobin was 10.7 in August 2023.  Upper endoscopy in 2020 with evidence of gastritis and esophageal stricture.  Last colonoscopy 2013.  Due for repeat.  Denies any alarm symptoms at this time.  PLAN   Continue lansoprazole 30 mg BID.  Start famotidine 40 mg nightly.  Proceed with colonoscopy with propofol by Dr. Marletta Lor in near future: the risks, benefits, and alternatives have been discussed with the patient in detail. The patient states understanding and desires to  proceed. ASA 3  Hold metformin night prior to and morning of procedure.  Avoid gas producing foods Continue stool  softeners daily or every other day. GERD diet Follow up in 4 months.    Brooke Bonito, MSN, FNP-BC, AGACNP-BC [facility]

## 2022-09-12 NOTE — Telephone Encounter (Signed)
[employer] PA: Notification or Prior Authorization is not required for the requested services You are not required to submit a notification/prior authorization based on the information provided. If you have general questions about the prior authorization requirements, visit [email or web address] > Clinician Resources > Advance and Admission Notification Requirements. The number above acknowledges your notification. Please write this reference number down for future reference. If you would like to request an organization determination, please call us at [phone]. Decision ID #: [id number]

## 2022-09-12 NOTE — Patient Instructions (Addendum)
Follow a GERD diet:  Avoid fried, fatty, greasy, spicy, citrus foods. Avoid caffeine and carbonated beverages. Avoid chocolate. Try eating 4-6 small meals a day rather than 3 large meals. Do not eat within 3 hours of laying down. Prop head of bed up on wood or bricks to create a 6 inch incline.  Avoid gas-producing foods (eg, cabbage, legumes, onions, broccoli, brussel sprouts, wheat, and potatoes).   Continue lansoprazole and start famotidine 40 mg once nightly.   We are scheduling you for colonoscopy in the near future Dr. Marletta Lor  We will see you in 4 months, sooner if needed.  Please let me know if you have any worsening reflux symptoms in the meantime.   It was a pleasure to see you today. I want to create trusting relationships with patients. If you receive a survey regarding your visit,  I greatly appreciate you taking time to fill this out on paper or through your MyChart. I value your feedback.  Brooke Bonito, MSN, FNP-BC, AGACNP-BC [facility]

## 2022-09-13 ENCOUNTER — Encounter: Payer: Self-pay | Admitting: *Deleted

## 2022-10-08 DIAGNOSIS — E119 Type 2 diabetes mellitus without complications: Secondary | ICD-10-CM | POA: Diagnosis not present

## 2022-10-16 DIAGNOSIS — E1140 Type 2 diabetes mellitus with diabetic neuropathy, unspecified: Secondary | ICD-10-CM | POA: Diagnosis not present

## 2022-10-17 NOTE — Patient Instructions (Signed)
Kristin Farley  10/17/2022     @PREFPERIOPPHARMACY @   Your procedure is scheduled on 10/23/2022.   Report to [facility] at  0600  A.M.   Call this number if you have problems the morning of surgery:  [phone]  If you experience any cold or flu symptoms such as cough, fever, chills, shortness of breath, etc. between now and your scheduled surgery, please notify us at the above number.   Remember:  Follow the diet and prep instructions given to you by the office.    Take these medicines the morning of surgery with A SIP OF WATER       pepcid, lansoprazole, mirabegron, nurtec(if needed).     Do not wear jewelry, make-up or nail polish, including gel polish,  artificial nails, or any other type of covering on natural nails (fingers and  toes).  Do not wear lotions, powders, or perfumes, or deodorant.  Do not shave 48 hours prior to surgery.  Men may shave face and neck.  Do not bring valuables to the hospital.  [facility] is not responsible for any belongings or valuables.  Contacts, dentures or bridgework may not be worn into surgery.  Leave your suitcase in the car.  After surgery it may be brought to your room.  For patients admitted to the hospital, discharge time will be determined by your treatment team.  Patients discharged the day of surgery will not be allowed to drive home and must have someone with them for 24 hours.    Special instructions:   DO NOT smoke tobacco or vape for 24 hours before your procedure.  Please read over the following fact sheets that you were given. Anesthesia Post-op Instructions and Care and Recovery After Surgery      Colonoscopy, Adult, Care After The following information offers guidance on how to care for yourself after your procedure. Your health care provider may also give you more specific instructions. If you have problems or questions, contact your health care provider. What can I expect after the  procedure? After the procedure, it is common to have: A small amount of blood in your stool for 24 hours after the procedure. Some gas. Mild cramping or bloating of your abdomen. Follow these instructions at home: Eating and drinking  Drink enough fluid to keep your urine pale yellow. Follow instructions from your health care provider about eating or drinking restrictions. Resume your normal diet as told by your health care provider. Avoid heavy or fried foods that are hard to digest. Activity Rest as told by your health care provider. Avoid sitting for a long time without moving. Get up to take short walks every 1-2 hours. This is important to improve blood flow and breathing. Ask for help if you feel weak or unsteady. Return to your normal activities as told by your health care provider. Ask your health care provider what activities are safe for you. Managing cramping and bloating  Try walking around when you have cramps or feel bloated. If directed, apply heat to your abdomen as told by your health care provider. Use the heat source that your health care provider recommends, such as a moist heat pack or a heating pad. Place a towel between your skin and the heat source. Leave the heat on for 20-30 minutes. Remove the heat if your skin turns bright red. This is especially important if you are unable to feel pain, heat, or cold. You have a  greater risk of getting burned. General instructions If you were given a sedative during the procedure, it can affect you for several hours. Do not drive or operate machinery until your health care provider says that it is safe. For the first 24 hours after the procedure: Do not sign important documents. Do not drink alcohol. Do your regular daily activities at a slower pace than normal. Eat soft foods that are easy to digest. Take over-the-counter and prescription medicines only as told by your health care provider. Keep all follow-up visits. This  is important. Contact a health care provider if: You have blood in your stool 2-3 days after the procedure. Get help right away if: You have more than a small spotting of blood in your stool. You have large blood clots in your stool. You have swelling of your abdomen. You have nausea or vomiting. You have a fever. You have increasing pain in your abdomen that is not relieved with medicine. These symptoms may be an emergency. Get help right away. Call 911. Do not wait to see if the symptoms will go away. Do not drive yourself to the hospital. Summary After the procedure, it is common to have a small amount of blood in your stool. You may also have mild cramping and bloating of your abdomen. If you were given a sedative during the procedure, it can affect you for several hours. Do not drive or operate machinery until your health care provider says that it is safe. Get help right away if you have a lot of blood in your stool, nausea or vomiting, a fever, or increased pain in your abdomen. This information is not intended to replace advice given to you by your health care provider. Make sure you discuss any questions you have with your health care provider. Document Revised: 05/09/2022 Document Reviewed: 11/17/2020 Elsevier Patient Education  2024 [employer] Monitored Anesthesia Care, Care After The following information offers guidance on how to care for yourself after your procedure. Your health care provider may also give you more specific instructions. If you have problems or questions, contact your health care provider. What can I expect after the procedure? After the procedure, it is common to have: Tiredness. Little or no memory about what happened during or after the procedure. Impaired judgment when it comes to making decisions. Nausea or vomiting. Some trouble with balance. Follow these instructions at home: For the time period you were told by your health care  provider:  Rest. Do not participate in activities where you could fall or become injured. Do not drive or use machinery. Do not drink alcohol. Do not take sleeping pills or medicines that cause drowsiness. Do not make important decisions or sign legal documents. Do not take care of children on your own. Medicines Take over-the-counter and prescription medicines only as told by your health care provider. If you were prescribed antibiotics, take them as told by your health care provider. Do not stop using the antibiotic even if you start to feel better. Eating and drinking Follow instructions from your health care provider about what you may eat and drink. Drink enough fluid to keep your urine pale yellow. If you vomit: Drink clear fluids slowly and in small amounts as you are able. Clear fluids include water, ice chips, low-calorie sports drinks, and fruit juice that has water added to it (diluted fruit juice). Eat light and bland foods in small amounts as you are able. These foods include bananas, applesauce, rice, lean meats,  toast, and crackers. General instructions  Have a responsible adult stay with you for the time you are told. It is important to have someone help care for you until you are awake and alert. If you have sleep apnea, surgery and some medicines can increase your risk for breathing problems. Follow instructions from your health care provider about wearing your sleep device: When you are sleeping. This includes during daytime naps. While taking prescription pain medicines, sleeping medicines, or medicines that make you drowsy. Do not use any products that contain nicotine or tobacco. These products include cigarettes, chewing tobacco, and vaping devices, such as e-cigarettes. If you need help quitting, ask your health care provider. Contact a health care provider if: You feel nauseous or vomit every time you eat or drink. You feel light-headed. You are still sleepy or  having trouble with balance after 24 hours. You get a rash. You have a fever. You have redness or swelling around the IV site. Get help right away if: You have trouble breathing. You have new confusion after you get home. These symptoms may be an emergency. Get help right away. Call 911. Do not wait to see if the symptoms will go away. Do not drive yourself to the hospital. This information is not intended to replace advice given to you by your health care provider. Make sure you discuss any questions you have with your health care provider. Document Revised: 08/22/2021 Document Reviewed: 08/22/2021 Elsevier Patient Education  2024 [employer].

## 2022-10-18 ENCOUNTER — Encounter (HOSPITAL_COMMUNITY): Payer: Self-pay

## 2022-10-18 ENCOUNTER — Encounter (HOSPITAL_COMMUNITY)
Admission: RE | Admit: 2022-10-18 | Discharge: 2022-10-18 | Disposition: A | Discharge status: Home / Self Care | Payer: Medicare Other | Source: Ambulatory Visit | Attending: Internal Medicine | Admitting: Internal Medicine

## 2022-10-18 VITALS — BP 107/71 | HR 70 | Temp 98.3°F | Resp 18 | Ht 62.0 in | Wt 183.6 lb

## 2022-10-18 DIAGNOSIS — E119 Type 2 diabetes mellitus without complications: Secondary | ICD-10-CM | POA: Insufficient documentation

## 2022-10-18 DIAGNOSIS — Z01818 Encounter for other preprocedural examination: Secondary | ICD-10-CM | POA: Diagnosis not present

## 2022-10-18 LAB — BASIC METABOLIC PANEL
Anion gap: 6 (ref 5–15)
BUN: 9 mg/dL (ref 8–23)
CO2: 26 mmol/L (ref 22–32)
Calcium: 9.3 mg/dL (ref 8.9–10.3)
Chloride: 104 mmol/L (ref 98–111)
Creatinine, Ser: 0.77 mg/dL (ref 0.44–1.00)
GFR, Estimated: >60 mL/min (ref >60)
Glucose, Bld: 148 mg/dL — ABNORMAL HIGH (ref 70–99)
Potassium: 3.9 mmol/L (ref 3.5–5.1)
Sodium: 136 mmol/L (ref 135–145)

## 2022-10-23 ENCOUNTER — Ambulatory Visit (HOSPITAL_COMMUNITY)
Admission: RE | Admit: 2022-10-23 | Discharge: 2022-10-23 | Disposition: A | Discharge status: Home / Self Care | Payer: Medicare Other | Attending: Internal Medicine | Admitting: Internal Medicine

## 2022-10-23 ENCOUNTER — Ambulatory Visit (HOSPITAL_BASED_OUTPATIENT_CLINIC_OR_DEPARTMENT_OTHER): Payer: Medicare Other | Admitting: Anesthesiology

## 2022-10-23 ENCOUNTER — Ambulatory Visit (HOSPITAL_COMMUNITY): Payer: Medicare Other | Admitting: Anesthesiology

## 2022-10-23 ENCOUNTER — Encounter (HOSPITAL_COMMUNITY)
Admission: RE | Disposition: A | Discharge status: Home / Self Care | Payer: Self-pay | Source: Home / Self Care | Attending: Internal Medicine

## 2022-10-23 DIAGNOSIS — G4733 Obstructive sleep apnea (adult) (pediatric): Secondary | ICD-10-CM | POA: Insufficient documentation

## 2022-10-23 DIAGNOSIS — I10 Essential (primary) hypertension: Secondary | ICD-10-CM | POA: Insufficient documentation

## 2022-10-23 DIAGNOSIS — Z1211 Encounter for screening for malignant neoplasm of colon: Secondary | ICD-10-CM

## 2022-10-23 DIAGNOSIS — M1990 Unspecified osteoarthritis, unspecified site: Secondary | ICD-10-CM | POA: Insufficient documentation

## 2022-10-23 DIAGNOSIS — D120 Benign neoplasm of cecum: Secondary | ICD-10-CM | POA: Diagnosis not present

## 2022-10-23 DIAGNOSIS — D126 Benign neoplasm of colon, unspecified: Secondary | ICD-10-CM | POA: Diagnosis not present

## 2022-10-23 DIAGNOSIS — Z7984 Long term (current) use of oral hypoglycemic drugs: Secondary | ICD-10-CM | POA: Diagnosis not present

## 2022-10-23 DIAGNOSIS — Q438 Other specified congenital malformations of intestine: Secondary | ICD-10-CM | POA: Diagnosis not present

## 2022-10-23 DIAGNOSIS — D122 Benign neoplasm of ascending colon: Secondary | ICD-10-CM | POA: Diagnosis not present

## 2022-10-23 DIAGNOSIS — I341 Nonrheumatic mitral (valve) prolapse: Secondary | ICD-10-CM | POA: Diagnosis not present

## 2022-10-23 DIAGNOSIS — E119 Type 2 diabetes mellitus without complications: Secondary | ICD-10-CM | POA: Diagnosis not present

## 2022-10-23 DIAGNOSIS — G43909 Migraine, unspecified, not intractable, without status migrainosus: Secondary | ICD-10-CM | POA: Insufficient documentation

## 2022-10-23 DIAGNOSIS — D123 Benign neoplasm of transverse colon: Secondary | ICD-10-CM | POA: Diagnosis not present

## 2022-10-23 DIAGNOSIS — K219 Gastro-esophageal reflux disease without esophagitis: Secondary | ICD-10-CM | POA: Insufficient documentation

## 2022-10-23 DIAGNOSIS — K635 Polyp of colon: Secondary | ICD-10-CM | POA: Diagnosis not present

## 2022-10-23 DIAGNOSIS — G709 Myoneural disorder, unspecified: Secondary | ICD-10-CM | POA: Diagnosis not present

## 2022-10-23 HISTORY — PX: COLONOSCOPY WITH PROPOFOL: SHX5780

## 2022-10-23 HISTORY — PX: POLYPECTOMY: SHX5525

## 2022-10-23 LAB — GLUCOSE, CAPILLARY: Glucose-Capillary: 112 mg/dL — ABNORMAL HIGH (ref 70–99)

## 2022-10-23 SURGERY — COLONOSCOPY WITH PROPOFOL
Anesthesia: General

## 2022-10-23 MED ORDER — STERILE WATER FOR IRRIGATION IR SOLN
Status: DC | PRN
  Administered 2022-10-23: 60 mL

## 2022-10-23 MED ORDER — LACTATED RINGERS IV SOLN: INTRAVENOUS | Status: DC

## 2022-10-23 MED ORDER — PROPOFOL 10 MG/ML IV BOLUS
INTRAVENOUS | Status: DC | PRN
Start: 2022-10-23 — End: 2022-10-23
  Administered 2022-10-23: 20 mg via INTRAVENOUS
  Administered 2022-10-23: 60 mg via INTRAVENOUS

## 2022-10-23 MED ORDER — PROPOFOL 500 MG/50ML IV EMUL
INTRAVENOUS | Status: DC | PRN
  Administered 2022-10-23: 150 ug/kg/min via INTRAVENOUS

## 2022-10-23 NOTE — Transfer of Care (Signed)
Immediate Anesthesia Transfer of Care Note  Patient: Kristin Farley  Procedure(s) Performed: COLONOSCOPY WITH PROPOFOL POLYPECTOMY  Patient Location: PACU  Anesthesia Type:General  Level of Consciousness: awake, alert , oriented, and patient cooperative  Airway & Oxygen Therapy: Patient Spontanous Breathing  Post-op Assessment: Report given to RN, Post -op Vital signs reviewed and stable, and Patient moving all extremities X 4  Post vital signs: Reviewed and stable  Last Vitals:  Vitals Value Taken Time  BP 100/86 10/23/22 0808  Temp 36.4 C 10/23/22 0808  Pulse 72 10/23/22 0808  Resp 17 10/23/22 0808  SpO2 100 % 10/23/22 0808    Last Pain:  Vitals:   10/23/22 0808  TempSrc: Axillary  PainSc: Asleep      Patients Stated Pain Goal: 4 (10/23/22 9629)  Complications: No notable events documented.

## 2022-10-23 NOTE — Anesthesia Postprocedure Evaluation (Signed)
Anesthesia Post Note  Patient: Kristin Farley  Procedure(s) Performed: COLONOSCOPY WITH PROPOFOL POLYPECTOMY  Patient location during evaluation: Phase II Anesthesia Type: General Level of consciousness: awake and alert and oriented Pain management: pain level controlled Vital Signs Assessment: post-procedure vital signs reviewed and stable Respiratory status: spontaneous breathing, nonlabored ventilation and respiratory function stable Cardiovascular status: blood pressure returned to baseline and stable Postop Assessment: no apparent nausea or vomiting Anesthetic complications: no  No notable events documented.   Last Vitals:  Vitals:   10/23/22 0648 10/23/22 0808  BP: 126/76 100/86  Pulse: 79 72  Resp: 16 17  Temp: 36.5 C 36.4 C  SpO2: 100% 100%    Last Pain:  Vitals:   10/23/22 0808  TempSrc: Axillary  PainSc: Asleep                 Loriana Samad C Tylerjames Hoglund

## 2022-10-23 NOTE — H&P (Signed)
Primary Care Physician:  [name], MD Primary Gastroenterologist:  Dr. Marletta Lor  Pre-Procedure History & Physical: HPI:  Kristin Farley is a 77 y.o. female is here for a colonoscopy for colon cancer screening purposes.  Patient denies any family history of colorectal cancer.  No melena or hematochezia.  No abdominal pain or unintentional weight loss.  No change in bowel habits.  Overall feels well from a GI standpoint.  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    Urgency of urination     Past Surgical History:  Procedure Laterality Date   BIOPSY  06/28/2018   Procedure: BIOPSY;  Surgeon: West Bali, MD;  Location: [facility];  Service: Endoscopy;;   CATARACT EXTRACTION W/ INTRAOCULAR LENS  IMPLANT, BILATERAL     COLONOSCOPY  03/22/2012    Romie Levee, MD-NL BUT INCOMPLETE   DILATION AND CURETTAGE OF UTERUS     ESOPHAGOGASTRODUODENOSCOPY N/A 06/28/2018   Procedure: ESOPHAGOGASTRODUODENOSCOPY (EGD);  Surgeon: West Bali, MD;  Location: [facility];  Service: Endoscopy;  Laterality: N/A;  12:00pm   JOINT REPLACEMENT     KNEE ARTHROSCOPY Left 01/2017   PUBOVAGINAL SLING  08/25/2011   Procedure: Leonides Grills;  Surgeon: Kathi Ludwig, MD;  Location: [facility];  Service: Urology;  Laterality: N/A;  BOSTON SCIENTIFIC MESH UPHOLD LITE ANTERIOR VAULT REPAIR    SHOULDER ARTHROSCOPY WITH OPEN ROTATOR CUFF REPAIR AND  DISTAL CLAVICLE ACROMINECTOMY Right 08/10/2021   Procedure: SHOULDER ARTHROSCOPY WITH ACROMIOPLASTY, OPEN ROTATOR CUFF REPAIR AND OPEN DISTAL CLAVICLECTOMY;  Surgeon: Frederico Hamman, MD;  Location: [facility];  Service: Orthopedics;  Laterality: Right;   TONSILLECTOMY AND ADENOIDECTOMY  AGE 75   TOTAL ABDOMINAL HYSTERECTOMY  1990   w/BSO   TOTAL KNEE ARTHROPLASTY Left 11/02/2017   TOTAL KNEE ARTHROPLASTY Left 11/02/2017   Procedure: LEFT TOTAL KNEE ARTHROPLASTY;  Surgeon: Frederico Hamman, MD;  Location: [facility];  Service: Orthopedics;  Laterality: Left;    Prior to Admission medications   Medication Sig Start Date End Date Taking? Authorizing Provider  acetaminophen (TYLENOL) 650 MG CR tablet Take 650 mg by mouth at bedtime.   Yes [provider]  Alum & Mag Hydroxide-Simeth (MYLANTA PO) Take 15-30 mLs by mouth 2 (two) times daily as needed (upset stomach).    Yes [provider]  aspirin EC 81 MG tablet Take 81 mg by mouth daily.   Yes [provider]  atenolol (TENORMIN) 25 MG tablet Take 25 mg by mouth at bedtime.    Yes [provider]  atorvastatin (LIPITOR) 20 MG tablet Take 20 mg by mouth every evening.  01/18/12  Yes [provider]  benazepril (LOTENSIN) 5 MG tablet Take 5 mg by mouth daily.  06/26/13  Yes [provider]  Cholecalciferol (VITAMIN D) 2000 units  tablet Take 2,000 Units by mouth daily.   Yes [provider]  docusate sodium (COLACE) 100 MG capsule Take 100 mg by mouth every other day.   Yes [provider]  famotidine (PEPCID) 40 MG tablet TAKE 1 TABLET(40 MG) BY MOUTH DAILY 09/12/22  Yes Mahon, Courtney L, NP  l-methylfolate-B6-B12 (METANX) 3-35-2 MG TABS tablet Take 1 tablet by mouth daily.   Yes [provider]  lansoprazole (PREVACID) 30 MG capsule Take 30 mg by mouth 2 (two) times daily. 09/06/22  Yes [provider]  Magnesium 250 MG TABS Take 250 mg by mouth 2 (two)  times a week.   Yes [provider]  metFORMIN (GLUCOPHAGE) 500 MG tablet Take 1,000 mg by mouth 2 (two) times daily with a meal.   Yes [provider]  mirabegron ER (MYRBETRIQ) 50 MG TB24 tablet Take 50 mg by mouth daily.   Yes [provider]  NURTEC 75 MG TBDP Take 1 tablet by mouth daily as needed (Migraine). 06/21/22  Yes [provider]  OVER THE COUNTER MEDICATION Take 1 tablet by mouth daily at 6 (six) AM. Insta Flex for joints daily   Yes [provider]  ST JOHNS WORT PO Take 600 mg by mouth 2 (two) times daily.   Yes [provider]    Allergies as of 09/12/2022 - Review Complete 09/12/2022  Allergen Reaction Noted   Elemental sulfur Nausea And Vomiting 10/17/2017    Family History  Problem Relation Age of Onset   Cancer Mother        lung   Alcohol abuse Father    Colon cancer Neg Hx    Colon polyps Neg Hx     Social History   Socioeconomic History   Marital status: Divorced    Spouse name: Not on file   Number of children: Not on file   Years of education: Not on file   Highest education level: Not on file  Occupational History   Not on file  Tobacco Use   Smoking status: Never   Smokeless tobacco: Never  Vaping Use   Vaping status: Never Used  Substance and Sexual Activity   Alcohol use: No   Drug use: No   Sexual activity: Not Currently  Other Topics Concern   Not on file  Social History Narrative   Not on file   Social Determinants of Health   Financial Resource Strain: Not on file  Food Insecurity: Not on file  Transportation Needs: Not on file  Physical Activity: Not on file  Stress: Not on file  Social Connections: Not on file  Intimate Partner Violence: Not on file    Review of Systems: See HPI, otherwise negative ROS  Physical Exam: Vital signs in last 24 hours: Temp:  [97.7 F (36.5 C)] 97.7 F (36.5 C) (07/15 0648) Pulse Rate:  [79] 79 (07/15 0648) Resp:  [16] 16 (07/15  0648) BP: (126)/(76) 126/76 (07/15 0648) SpO2:  [100 %] 100 % (07/15 0648) Weight:  [83.3 kg] 83.3 kg (07/15 0648)   General:   Alert,  Well-developed, well-nourished, pleasant and cooperative in NAD Head:  Normocephalic and atraumatic. Eyes:  Sclera clear, no icterus.   Conjunctiva pink. Ears:  Normal auditory acuity. Nose:  No deformity, discharge,  or lesions. Msk:  Symmetrical without gross deformities. Normal posture. Extremities:  Without clubbing or edema. Neurologic:  Alert and  oriented x4;  grossly normal neurologically. Skin:  Intact without significant lesions or rashes. Psych:  Alert and cooperative. Normal mood and affect.  Impression/Plan: Kristin Farley is here for a colonoscopy to be performed for colon cancer screening purposes.  The risks of the procedure including infection, bleed, or perforation as well as benefits, limitations, alternatives and imponderables have been reviewed with the patient. Questions have been answered. All parties agreeable.

## 2022-10-23 NOTE — Op Note (Signed)
[facility] Patient Name: Kristin Farley Procedure Date: 10/23/2022 7:18 AM MRN: [id number] Date of Birth: 1946-02-18 Attending MD: Hennie Duos. Marletta Lor , [place], [phone] CSN: [id number] Age: 77 Admit Type: Outpatient Procedure:                Colonoscopy Indications:              Screening for colorectal malignant neoplasm Providers:                Hennie Duos. Marletta Lor, DO, Angelica Ran, Kristine L.                            Jessee Avers, Technician Referring MD:              Medicines:                See the Anesthesia note for documentation of the                            administered medications Complications:            No immediate complications. Estimated Blood Loss:     Estimated blood loss was minimal. Procedure:                Pre-Anesthesia Assessment:                           - The anesthesia plan was to use monitored                            anesthesia care (MAC).                           After obtaining informed consent, the colonoscope                            was passed under direct vision. Throughout the                            procedure, the patient's blood pressure, pulse, and                            oxygen saturations were monitored continuously. The                            [id number]-HQ190L ([id number]) scope was introduced through                            the anus and advanced to the the cecum, identified                            by appendiceal orifice and ileocecal valve. The                            colonoscopy was technically difficult and complex                            due to significant looping  and a tortuous colon.                            Successful completion of the procedure was aided by                            applying abdominal pressure. The patient tolerated                            the procedure well. The quality of the bowel                            preparation was evaluated using the BBPS [facility]                             Bowel Preparation Scale) with scores of: Right                            Colon = 3, Transverse Colon = 3 and Left Colon = 3                            (entire mucosa seen well with no residual staining,                            small fragments of stool or opaque liquid). The                            total BBPS score equals 9. Scope In: 7:37:08 AM Scope Out: 8:02:42 AM Scope Withdrawal Time: 0 hours 10 minutes 26 seconds  Total Procedure Duration: 0 hours 25 minutes 34 seconds  Findings:      Two sessile polyps were found in the cecum. The polyps were 3 to 5 mm in       size. These polyps were removed with a cold snare. Resection and       retrieval were complete.      A 6 mm polyp was found in the transverse colon. The polyp was sessile.       The polyp was removed with a cold snare. Resection and retrieval were       complete.      The colon (entire examined portion) was significantly tortuous.       Advancing the scope required applying abdominal pressure. Impression:               - Two 3 to 5 mm polyps in the cecum, removed with a                            cold snare. Resected and retrieved.                           - One 6 mm polyp in the transverse colon, removed                            with a cold snare. Resected and retrieved.                           -  Tortuous colon. Moderate Sedation:      Per Anesthesia Care Recommendation:           - Patient has a contact number available for                            emergencies. The signs and symptoms of potential                            delayed complications were discussed with the                            patient. Return to normal activities tomorrow.                            Written discharge instructions were provided to the                            patient.                           - Resume previous diet.                           - Continue present medications.                           - Await pathology  results.                           - No repeat colonoscopy due to age.                           - Return to GI clinic in 4 months. Procedure Code(s):        --- Professional ---                           [phone], Colonoscopy, flexible; with removal of                            tumor(s), polyp(s), or other lesion(s) by snare                            technique Diagnosis Code(s):        --- Professional ---                           Z12.11, Encounter for screening for malignant                            neoplasm of colon                           D12.2, Benign neoplasm of ascending colon                           D12.0, Benign neoplasm of cecum  D12.3, Benign neoplasm of transverse colon (hepatic                            flexure or splenic flexure)                           Q43.8, Other specified congenital malformations of                            intestine CPT copyright 2022 American Medical Association. All rights reserved. The codes documented in this report are preliminary and upon coder review may  be revised to meet current compliance requirements. Hennie Duos. Marletta Lor, DO Hennie Duos. Marletta Lor, DO 10/23/2022 8:06:34 AM This report has been signed electronically. Number of Addenda: 0

## 2022-10-23 NOTE — Discharge Instructions (Addendum)
  Colonoscopy Discharge Instructions  Read the instructions outlined below and refer to this sheet in the next few weeks. These discharge instructions provide you with general information on caring for yourself after you leave the hospital. Your doctor may also give you specific instructions. While your treatment has been planned according to the most current medical practices available, unavoidable complications occasionally occur.   ACTIVITY You may resume your regular activity, but move at a slower pace for the next 24 hours.  Take frequent rest periods for the next 24 hours.  Walking will help get rid of the air and reduce the bloated feeling in your belly (abdomen).  No driving for 24 hours (because of the medicine (anesthesia) used during the test).   Do not sign any important legal documents or operate any machinery for 24 hours (because of the anesthesia used during the test).  NUTRITION Drink plenty of fluids.  You may resume your normal diet as instructed by your doctor.  Begin with a light meal and progress to your normal diet. Heavy or fried foods are harder to digest and may make you feel sick to your stomach (nauseated).  Avoid alcoholic beverages for 24 hours or as instructed.  MEDICATIONS You may resume your normal medications unless your doctor tells you otherwise.  WHAT YOU CAN EXPECT TODAY Some feelings of bloating in the abdomen.  Passage of more gas than usual.  Spotting of blood in your stool or on the toilet paper.  IF YOU HAD POLYPS REMOVED DURING THE COLONOSCOPY: No aspirin products for 7 days or as instructed.  No alcohol for 7 days or as instructed.  Eat a soft diet for the next 24 hours.  FINDING OUT THE RESULTS OF YOUR TEST Not all test results are available during your visit. If your test results are not back during the visit, make an appointment with your caregiver to find out the results. Do not assume everything is normal if you have not heard from your  caregiver or the medical facility. It is important for you to follow up on all of your test results.  SEEK IMMEDIATE MEDICAL ATTENTION IF: You have more than a spotting of blood in your stool.  Your belly is swollen (abdominal distention).  You are nauseated or vomiting.  You have a temperature over 101.  You have abdominal pain or discomfort that is severe or gets worse throughout the day.   Your colonoscopy revealed 3 polyp(s) which I removed successfully. Await pathology results, my office will contact you.  Given your age, I do not think we need to perform further colonoscopy for polyp surveillance.  Follow-up in GI office in 3 to 4 months.  I hope you have a great rest of your week!  Hennie Duos. Marletta Lor, D.O. Gastroenterology and Hepatology [facility]

## 2022-10-23 NOTE — Anesthesia Preprocedure Evaluation (Signed)
Anesthesia Evaluation  Patient identified by MRN, date of birth, ID band Patient awake    Reviewed: Allergy & Precautions, H&P , NPO status , Patient's Chart, lab work & pertinent test results  Airway Mallampati: II  TM Distance: >3 FB Neck ROM: Full    Dental  (+) Upper Dentures, Partial Lower, Dental Advisory Given   Pulmonary sleep apnea    Pulmonary exam normal breath sounds clear to auscultation       Cardiovascular hypertension, Pt. on medications Normal cardiovascular exam+ Valvular Problems/Murmurs MVP  Rhythm:Regular Rate:Normal     Neuro/Psych  Headaches PSYCHIATRIC DISORDERS Anxiety      Neuromuscular disease    GI/Hepatic Neg liver ROS,GERD  Medicated,,  Endo/Other  negative endocrine ROSdiabetes, Well Controlled, Type 2, Oral Hypoglycemic Agents    Renal/GU negative Renal ROS  negative genitourinary   Musculoskeletal  (+) Arthritis , Osteoarthritis,    Abdominal   Peds negative pediatric ROS (+)  Hematology negative hematology ROS (+)   Anesthesia Other Findings   Reproductive/Obstetrics negative OB ROS                             Anesthesia Physical Anesthesia Plan  ASA: 2  Anesthesia Plan: General   Post-op Pain Management: Minimal or no pain anticipated   Induction: Intravenous  PONV Risk Score and Plan:   Airway Management Planned: Nasal Cannula and Natural Airway  Additional Equipment:   Intra-op Plan:   Post-operative Plan:   Informed Consent: I have reviewed the patients History and Physical, chart, labs and discussed the procedure including the risks, benefits and alternatives for the proposed anesthesia with the patient or authorized representative who has indicated his/her understanding and acceptance.     Dental advisory given  Plan Discussed with: CRNA and Surgeon  Anesthesia Plan Comments:         Anesthesia Quick Evaluation

## 2022-10-24 LAB — SURGICAL PATHOLOGY

## 2022-10-26 ENCOUNTER — Encounter (HOSPITAL_COMMUNITY): Payer: Self-pay | Admitting: Internal Medicine

## 2022-10-26 DIAGNOSIS — E1165 Type 2 diabetes mellitus with hyperglycemia: Secondary | ICD-10-CM | POA: Diagnosis not present

## 2022-10-26 DIAGNOSIS — Z299 Encounter for prophylactic measures, unspecified: Secondary | ICD-10-CM | POA: Diagnosis not present

## 2022-10-26 DIAGNOSIS — I10 Essential (primary) hypertension: Secondary | ICD-10-CM | POA: Diagnosis not present

## 2022-11-08 DIAGNOSIS — E119 Type 2 diabetes mellitus without complications: Secondary | ICD-10-CM | POA: Diagnosis not present

## 2022-11-28 DIAGNOSIS — Z299 Encounter for prophylactic measures, unspecified: Secondary | ICD-10-CM | POA: Diagnosis not present

## 2022-11-28 DIAGNOSIS — Z0000 Encounter for general adult medical examination without abnormal findings: Secondary | ICD-10-CM | POA: Diagnosis not present

## 2022-11-28 DIAGNOSIS — E7800 Pure hypercholesterolemia, unspecified: Secondary | ICD-10-CM | POA: Diagnosis not present

## 2022-11-28 DIAGNOSIS — R5383 Other fatigue: Secondary | ICD-10-CM | POA: Diagnosis not present

## 2022-11-28 DIAGNOSIS — Z79899 Other long term (current) drug therapy: Secondary | ICD-10-CM | POA: Diagnosis not present

## 2022-12-01 DIAGNOSIS — H35033 Hypertensive retinopathy, bilateral: Secondary | ICD-10-CM | POA: Diagnosis not present

## 2022-12-01 DIAGNOSIS — H524 Presbyopia: Secondary | ICD-10-CM | POA: Diagnosis not present

## 2022-12-07 DIAGNOSIS — Z299 Encounter for prophylactic measures, unspecified: Secondary | ICD-10-CM | POA: Diagnosis not present

## 2022-12-07 DIAGNOSIS — E1165 Type 2 diabetes mellitus with hyperglycemia: Secondary | ICD-10-CM | POA: Diagnosis not present

## 2022-12-07 DIAGNOSIS — I10 Essential (primary) hypertension: Secondary | ICD-10-CM | POA: Diagnosis not present

## 2022-12-09 DIAGNOSIS — E119 Type 2 diabetes mellitus without complications: Secondary | ICD-10-CM | POA: Diagnosis not present

## 2023-01-04 DIAGNOSIS — E1140 Type 2 diabetes mellitus with diabetic neuropathy, unspecified: Secondary | ICD-10-CM | POA: Diagnosis not present

## 2023-01-08 DIAGNOSIS — E119 Type 2 diabetes mellitus without complications: Secondary | ICD-10-CM | POA: Diagnosis not present

## 2023-01-11 ENCOUNTER — Ambulatory Visit: Payer: Medicare Other | Admitting: Gastroenterology

## 2023-01-18 ENCOUNTER — Ambulatory Visit: Payer: Medicare Other | Admitting: Gastroenterology

## 2023-01-18 ENCOUNTER — Encounter: Payer: Self-pay | Admitting: Gastroenterology

## 2023-01-18 VITALS — BP 124/71 | HR 94 | Temp 97.9°F | Ht 62.0 in | Wt 184.6 lb

## 2023-01-18 DIAGNOSIS — D649 Anemia, unspecified: Secondary | ICD-10-CM

## 2023-01-18 DIAGNOSIS — Q438 Other specified congenital malformations of intestine: Secondary | ICD-10-CM

## 2023-01-18 DIAGNOSIS — Z860101 Personal history of adenomatous and serrated colon polyps: Secondary | ICD-10-CM | POA: Diagnosis not present

## 2023-01-18 DIAGNOSIS — Z8719 Personal history of other diseases of the digestive system: Secondary | ICD-10-CM | POA: Diagnosis not present

## 2023-01-18 DIAGNOSIS — R143 Flatulence: Secondary | ICD-10-CM

## 2023-01-18 DIAGNOSIS — K219 Gastro-esophageal reflux disease without esophagitis: Secondary | ICD-10-CM | POA: Diagnosis not present

## 2023-01-18 MED ORDER — SIMETHICONE 125 MG PO TABS: 125.0000 mg | ORAL_TABLET | Freq: Three times a day (TID) | ORAL | 1 refills | Status: DC | PRN

## 2023-01-18 MED ORDER — LANSOPRAZOLE 30 MG PO CPDR: 30.0000 mg | DELAYED_RELEASE_CAPSULE | Freq: Two times a day (BID) | ORAL | 1 refills | Status: DC

## 2023-01-18 NOTE — Progress Notes (Signed)
GI Office Note    Referring Provider: [name], MD Primary Care Physician:  [name], MD Primary Gastroenterologist: Hennie Duos. Marletta Lor, DO  Date:  01/18/2023  ID:  Yoselyn, Mcglade 22-Dec-1945, MRN [id number]  Chief Complaint   Chief Complaint  Patient presents with   Follow-up    Follow up. Having heart burn and gas.    History of Present Illness  SHANETTA NICOLLS is a 77 y.o. female with a history of GERD, anxiety, HTN, HLD, migraines, OSA with CPAP, type 2 diabetes, and mitral valve prolapse presenting today with complaint of frequent flatulence.  Colonoscopy in 2013:  -no masses on DRE -unable to get through past the transverse colon   EGD 06/28/2018: -Benign-appearing esophageal stricture due to GERD -Mild gastritis secondary to NSAIDs -Multiple gastric polyps, removed.  -Advised diabetic diet and if no evidence of celiac or H. pylori she will need antibiotics for SIBO -path: normal, no H. pylori.  Labs in 2020 with negative celiac serologies.  Last office visit 09/12/22.  Patient reported her last colonoscopy was in [place] but unsure who did it or when.  Does report issues with acid reflux.  Has trouble with onions.  Thinks metformin causing diarrhea.  Takes Mylanta with honey for her reflux.  Had just stopped omeprazole and started on lansoprazole and taking it twice daily.  Denied any dysphagia.  Stop taking diclofenac.  Denied any alcohol or tobacco use.  Diarrhea not occurring every day.  Tries to avoid eating after 8 PM.  Takes a stool softener on a regular basis.  Advised to continue lansoprazole and start famotidine 40 mg nightly.  Proceed with colonoscopy.  Avoid gas producing foods and continue stool softener daily or every other day.     Latest Ref Rng & Units 06/25/2018   12:29 PM 11/05/2017    5:08 AM 11/04/2017    3:13 AM  CBC  WBC 3.8 - 10.8 Thousand/uL 5.1  7.8  7.8   Hemoglobin 11.7 - 15.5 g/dL 78.2  9.8  95.6   Hematocrit 35.0 - 45.0 %  35.5  31.7  33.3   Platelets 140 - 400 Thousand/uL 264  200  210     Colonoscopy 10/23/2022: - two 3-5 mm polyps in the cecum -6 mm polyp in the transverse colon -tortuous colon -Path: Tube adenomas without dysplasia -No repeat colonoscopy due to age  Labs via [facility] system reviewed dated 11/28/2022: Hemoglobin 11, platelets 260, CMP unremarkable.  TSH within normal limits.  Total cholesterol and LDL elevated.  Today: Gassiness - She states that everything she eats turns to gas. Reports it is embarrassing. She states that since she had her bladder tacked up she had gas. She has been taking a lot of gas ex. Tries to have a BM prior to church to avoid embarrassment.  She reports she has been tacking a probiotc by natures bounty probiotic.   Heartburn - Has been out of her lansoprazole for about a week. No real changes in her reflux. Has been living off of Mylanta if she eats late - 3-4 nights per week, very small amount. If she eats before 8pm she has symptoms. Taking the famotidine 40 mg at night before bed.   Takes a stool softener about 3-4 times per week. Does not feel constipated. Not often but has miralax as needed. Has diarrhea that comes and goes as well from metformin and if its severe she will take imodium.   Drinks lactaid, milk tears her  stomach up.    Current Outpatient Medications  Medication Sig Dispense Refill   acetaminophen (TYLENOL) 650 MG CR tablet Take 650 mg by mouth at bedtime.     Alum & Mag Hydroxide-Simeth (MYLANTA PO) Take 15-30 mLs by mouth 2 (two) times daily as needed (upset stomach).      aspirin EC 81 MG tablet Take 81 mg by mouth daily.     atenolol (TENORMIN) 25 MG tablet Take 25 mg by mouth at bedtime.      atorvastatin (LIPITOR) 20 MG tablet Take 20 mg by mouth every evening.      benazepril (LOTENSIN) 5 MG tablet Take 5 mg by mouth daily.      Cholecalciferol (VITAMIN D) 2000 units tablet Take 2,000 Units by mouth daily.     docusate sodium (COLACE)  100 MG capsule Take 100 mg by mouth every other day.     famotidine (PEPCID) 40 MG tablet TAKE 1 TABLET(40 MG) BY MOUTH DAILY 90 tablet 1   l-methylfolate-B6-B12 (METANX) 3-35-2 MG TABS tablet Take 1 tablet by mouth daily.     Magnesium 250 MG TABS Take 250 mg by mouth 2 (two) times a week.     metFORMIN (GLUCOPHAGE) 500 MG tablet Take 1,000 mg by mouth 2 (two) times daily with a meal.     mirabegron ER (MYRBETRIQ) 50 MG TB24 tablet Take 50 mg by mouth daily.     NURTEC 75 MG TBDP Take 1 tablet by mouth daily as needed (Migraine).     OVER THE COUNTER MEDICATION Take 1 tablet by mouth daily at 6 (six) AM. Insta Flex for joints daily     ST JOHNS WORT PO Take 600 mg by mouth 2 (two) times daily.     lansoprazole (PREVACID) 30 MG capsule Take 30 mg by mouth 2 (two) times daily. (Patient not taking: Reported on 01/18/2023)     No current facility-administered medications for this visit.    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    Urgency of urination     Past Surgical History:  Procedure Laterality Date   BIOPSY  06/28/2018   Procedure: BIOPSY;  Surgeon: West Bali, MD;  [facility];  Service: Endoscopy;;   CATARACT EXTRACTION W/ INTRAOCULAR LENS  IMPLANT, BILATERAL     COLONOSCOPY  03/22/2012    Romie Levee, MD-NL BUT INCOMPLETE   COLONOSCOPY WITH PROPOFOL N/A 10/23/2022   Procedure: COLONOSCOPY WITH PROPOFOL;  Surgeon: Lanelle Bal, DO;  [facility];  Service: Endoscopy;  Laterality: N/A;  7:30 am, asa 3   DILATION AND CURETTAGE OF UTERUS     ESOPHAGOGASTRODUODENOSCOPY N/A 06/28/2018   Procedure: ESOPHAGOGASTRODUODENOSCOPY (EGD);  Surgeon: West Bali, MD;  [facility];  Service: Endoscopy;  Laterality: N/A;  12:00pm   JOINT  REPLACEMENT     KNEE ARTHROSCOPY Left 01/2017   POLYPECTOMY  10/23/2022   Procedure: POLYPECTOMY;  Surgeon: Lanelle Bal, DO;  [facility];  Service: Endoscopy;;   PUBOVAGINAL SLING  08/25/2011   Procedure: Leonides Grills;  Surgeon: Kathi Ludwig, MD;  Location: [facility];  Service: Urology;  Laterality: N/A;  BOSTON SCIENTIFIC MESH UPHOLD LITE ANTERIOR VAULT REPAIR    SHOULDER ARTHROSCOPY WITH OPEN ROTATOR CUFF REPAIR AND DISTAL CLAVICLE ACROMINECTOMY Right 08/10/2021   Procedure: SHOULDER ARTHROSCOPY WITH ACROMIOPLASTY, OPEN ROTATOR CUFF REPAIR AND OPEN DISTAL CLAVICLECTOMY;  Surgeon: Frederico Hamman, MD;  Location: [facility];  Service: Orthopedics;  Laterality: Right;   TONSILLECTOMY AND ADENOIDECTOMY  AGE 12   TOTAL ABDOMINAL HYSTERECTOMY  1990   w/BSO   TOTAL KNEE ARTHROPLASTY Left 11/02/2017   TOTAL KNEE ARTHROPLASTY Left 11/02/2017   Procedure: LEFT TOTAL KNEE ARTHROPLASTY;  Surgeon: Frederico Hamman, MD;  Location: [facility];  Service: Orthopedics;  Laterality: Left;    Family History  Problem Relation Age of Onset   Cancer Mother        lung   Alcohol abuse Father    Colon cancer Neg Hx    Colon polyps Neg Hx     Allergies as of 01/18/2023 - Review Complete 01/18/2023  Allergen Reaction Noted   Elemental sulfur Nausea And Vomiting 10/17/2017    Social History   Socioeconomic History   Marital status: Divorced    Spouse name: Not on file   Number of children: Not on file   Years of education: Not on file   Highest education level: Not on file  Occupational History   Not on file   Tobacco Use   Smoking status: Never   Smokeless tobacco: Never  Vaping Use   Vaping status: Never Used  Substance and Sexual Activity   Alcohol use: No   Drug use: No   Sexual activity: Not Currently  Other Topics Concern   Not on file  Social History Narrative   Not on file   Social Determinants of Health   Financial Resource Strain: Not on file  Food Insecurity: Not on file  Transportation Needs: Not on file  Physical Activity: Not on file  Stress: Not on file  Social Connections: Not on file     Review of Systems   Gen: Denies fever, chills, anorexia. Denies fatigue, weakness, weight loss.  CV: Denies chest pain, palpitations, syncope, peripheral edema, and claudication. Resp: Denies dyspnea at rest, cough, wheezing, coughing up blood, and pleurisy. GI: See HPI Derm: Denies rash, itching, dry skin Psych: Denies depression, anxiety, memory loss, confusion. No homicidal or suicidal ideation.  Heme: Denies bruising, bleeding, and enlarged lymph nodes.   Physical Exam   BP 124/71 (BP Location: Right Arm, Patient Position: Sitting, Cuff Size: Normal)   Pulse 94   Temp 97.9 F (36.6 C) (Temporal)   Ht 5\' 2"  (1.575 m)   Wt 184 lb 9.6 oz (83.7 kg)   BMI 33.76 kg/m   General:   Alert and oriented. No distress noted. Pleasant and cooperative.  Head:  Normocephalic and atraumatic. Eyes:  Conjuctiva clear without scleral icterus. Mouth:  Oral mucosa pink and moist. Good dentition. No lesions. Abdomen:  +BS, soft, non-distended. Ttp to epigastrium and mild ttp to LLQ. No rebound or guarding. No HSM or masses noted. Rectal: deferred Msk:  Symmetrical without gross deformities. Normal posture. Extremities:  Without edema. Neurologic:  Alert and  oriented  x4 Psych:  Alert and cooperative. Normal mood and affect.   Assessment  BIRDELLA SIPPEL is a 77 y.o. female with a history of GERD, anxiety, HTN, HLD, migraines, OSA with CPAP, type 2 diabetes, and mitral valve  prolapse presenting today with complaint of excessive gas and follow-up on GERD.  Anemia: Mild chronic anemia, hemoglobin stable at baseline which appears to be in the upper 01/1110 range.  Most recent hemoglobin in August was 11.  Last EGD in 2020 with gastritis.  Recent colonoscopy with 3 colon polyps and redundant colon noted.  Denies any significant fatigue, melena, BRBPR, dizziness, or lightheadedness.  Does have very rare vertigo.  GERD: Fairly controlled on twice daily lansoprazole.  Also taking famotidine 40 mg nightly before bed.  May eat after 8 PM a few nights per week and usually once occurs she has nocturnal symptoms and will have to take a small amount of Mylanta for relief.  Advised her to continue this current regimen.  Last EGD in 2020 with gastritis, negative for H. pylori.   Flatulence: Reports frequent foul-smelling flatulence throughout the day.  No bloating.  Has been avoiding lots of gas producing foods.  Labs in the past negative for H. pylori and celiac.  Denies any overt constipation. Has been taking otc gas ex as needed. Occasional diarrhea, potentially related to metformin or diet.  Will give simethicone 125 mg up to 3 times a day as needed.  Currently taking a natures bounty probiotic (acidophilus).  Advised other options for probiotic.  Will also perform sucrase testing.  If no evidence of sucrase deficiency and no improvement with different probiotic then may consider short-term antibiotic for potential SIBO as previously recommended in the past by Dr. Darrick Penna.  PLAN   Simethicone 125 mg TID as needed Sucrase testing Consider Align, phillips colon health, culturelle Continue lansoprazole 30 mg BID Continue famotidine 40 mg nightly GERD diet Continue Mylanta as needed for breakthrough Continue B12 and vitamin D daily Continue colace daily Consider low dose antibiotic for SIBO if ongoing symptoms Avoid gas producing foods including dairy. Follow up in 4  months    Brooke Bonito, MSN, FNP-BC, AGACNP-BC [facility]

## 2023-01-18 NOTE — Patient Instructions (Signed)
Continue lansoprazole 30 mg twice daily.  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  Also continue taking famotidine 40 mg nightly.  I have also sent in simethicone for you to take 125 mg up to 3 times a day for gas.  If for any reason insurance is unwilling to pay for this you can get this over-the-counter and higher doses rather than taking regular Gas-X. (I personally take Phazyme as needed for gassiness.  You can get the 250 mg tablets but she should take not more than 2 of these per day).  Probiotic recommendations: Culturelle Vear Clock' colon health Align Visibiome (this has been the pharmacy counter in the fridge, but not prescription)  If you do not have any improvement with the simethicone or new probiotic after 6-8 weeks then please let me know and we can consider a low-dose antibiotic short-term for SIBO.  I am providing you with a kit to assess for sucrase deficiency.  Please follow the directions and set the box to collect the sample correctly.  Results will be sent directly to me.  We will follow-up in the office in 4 months.  Please let me know how you are doing in 6-8 weeks and if no improvement I will give you further recommendations.  It was a pleasure to see you today. I want to create trusting relationships with patients. If you receive a survey regarding your visit,  I greatly appreciate you taking time to fill this out on paper or through your MyChart. I value your feedback.  Brooke Bonito, MSN, FNP-BC, AGACNP-BC [facility]

## 2023-02-15 DIAGNOSIS — E7800 Pure hypercholesterolemia, unspecified: Secondary | ICD-10-CM | POA: Diagnosis not present

## 2023-02-15 DIAGNOSIS — E1165 Type 2 diabetes mellitus with hyperglycemia: Secondary | ICD-10-CM | POA: Diagnosis not present

## 2023-02-15 DIAGNOSIS — I10 Essential (primary) hypertension: Secondary | ICD-10-CM | POA: Diagnosis not present

## 2023-02-15 DIAGNOSIS — Z299 Encounter for prophylactic measures, unspecified: Secondary | ICD-10-CM | POA: Diagnosis not present

## 2023-03-09 ENCOUNTER — Other Ambulatory Visit: Payer: Self-pay | Admitting: Gastroenterology

## 2023-03-09 DIAGNOSIS — E119 Type 2 diabetes mellitus without complications: Secondary | ICD-10-CM | POA: Diagnosis not present

## 2023-03-15 DIAGNOSIS — E1140 Type 2 diabetes mellitus with diabetic neuropathy, unspecified: Secondary | ICD-10-CM | POA: Diagnosis not present

## 2023-03-30 DIAGNOSIS — M25561 Pain in right knee: Secondary | ICD-10-CM | POA: Diagnosis not present

## 2023-04-06 DIAGNOSIS — M25561 Pain in right knee: Secondary | ICD-10-CM | POA: Diagnosis not present

## 2023-04-09 DIAGNOSIS — E119 Type 2 diabetes mellitus without complications: Secondary | ICD-10-CM | POA: Diagnosis not present

## 2023-04-12 DIAGNOSIS — M7121 Synovial cyst of popliteal space [Baker], right knee: Secondary | ICD-10-CM | POA: Diagnosis not present

## 2023-04-12 DIAGNOSIS — R609 Edema, unspecified: Secondary | ICD-10-CM | POA: Diagnosis not present

## 2023-04-12 DIAGNOSIS — M25561 Pain in right knee: Secondary | ICD-10-CM | POA: Diagnosis not present

## 2023-04-12 DIAGNOSIS — M948X6 Other specified disorders of cartilage, lower leg: Secondary | ICD-10-CM | POA: Diagnosis not present

## 2023-04-12 DIAGNOSIS — S83241A Other tear of medial meniscus, current injury, right knee, initial encounter: Secondary | ICD-10-CM | POA: Diagnosis not present

## 2023-04-27 DIAGNOSIS — M25561 Pain in right knee: Secondary | ICD-10-CM | POA: Diagnosis not present

## 2023-05-01 DIAGNOSIS — M25561 Pain in right knee: Secondary | ICD-10-CM | POA: Diagnosis not present

## 2023-05-09 DIAGNOSIS — E119 Type 2 diabetes mellitus without complications: Secondary | ICD-10-CM | POA: Diagnosis not present

## 2023-05-10 DIAGNOSIS — M25562 Pain in left knee: Secondary | ICD-10-CM | POA: Diagnosis not present

## 2023-05-23 ENCOUNTER — Ambulatory Visit: Payer: Medicare Other | Admitting: Gastroenterology

## 2023-05-30 ENCOUNTER — Ambulatory Visit: Payer: Medicare Other | Admitting: Gastroenterology

## 2023-06-07 DIAGNOSIS — E1169 Type 2 diabetes mellitus with other specified complication: Secondary | ICD-10-CM | POA: Diagnosis not present

## 2023-06-07 DIAGNOSIS — I10 Essential (primary) hypertension: Secondary | ICD-10-CM | POA: Diagnosis not present

## 2023-06-07 DIAGNOSIS — Z299 Encounter for prophylactic measures, unspecified: Secondary | ICD-10-CM | POA: Diagnosis not present

## 2023-06-07 DIAGNOSIS — N390 Urinary tract infection, site not specified: Secondary | ICD-10-CM | POA: Diagnosis not present

## 2023-06-07 DIAGNOSIS — R350 Frequency of micturition: Secondary | ICD-10-CM | POA: Diagnosis not present

## 2023-06-08 DIAGNOSIS — E119 Type 2 diabetes mellitus without complications: Secondary | ICD-10-CM | POA: Diagnosis not present

## 2023-06-12 DIAGNOSIS — M25562 Pain in left knee: Secondary | ICD-10-CM | POA: Diagnosis not present

## 2023-06-12 NOTE — Progress Notes (Deleted)
 GI Office Note    Referring Provider: [name], MD Primary Care Physician:  [name], MD Primary Gastroenterologist: Hennie Duos. Marletta Lor, DO  Date:  06/13/2023  ID:  Kristin Farley, DOB [birth year], MRN [id number]   Chief Complaint   No chief complaint on file.  History of Present Illness  Kristin Farley is a 78 y.o. female with a history of *** presenting today with complaint of   Colonoscopy in 2013:  -no masses on DRE -unable to get through past the transverse colon   EGD 06/28/2018: -Benign-appearing esophageal stricture due to GERD -Mild gastritis secondary to NSAIDs -Multiple gastric polyps, removed.  -Advised diabetic diet and if no evidence of celiac or H. pylori she will need antibiotics for SIBO -path: normal, no H. pylori.   Labs in 2020 with negative celiac serologies.  OV 09/12/22.  Patient reported her last colonoscopy was in [place] but unsure who did it or when.  Does report issues with acid reflux.  Has trouble with onions.  Thinks metformin causing diarrhea.  Takes Mylanta with honey for her reflux.  Had just stopped omeprazole and started on lansoprazole and taking it twice daily.  Denied any dysphagia.  Stop taking diclofenac.  Denied any alcohol or tobacco use.  Diarrhea not occurring every day.  Tries to avoid eating after 8 PM.  Takes a stool softener on a regular basis.  Advised to continue lansoprazole and start famotidine 40 mg nightly.  Proceed with colonoscopy.  Avoid gas producing foods and continue stool softener daily or every other day.  Colonoscopy 10/23/2022: - two 3-5 mm polyps in the cecum -6 mm polyp in the transverse colon -tortuous colon -Path: Tube adenomas without dysplasia -No repeat colonoscopy due to age   Labs via [facility] system reviewed dated 11/28/2022: Hemoglobin 11, platelets 260, CMP unremarkable.  TSH within normal limits.  Total cholesterol and LDL elevated.  Last office visit 01/18/23. *** Simethicone 125 mg 3  times daily, sucrase testing.  Consider probiotics.  Continue lansoprazole 30 mg twice daily, continue famotidine 40 mg nightly.  Continue Mylanta as needed for breakthrough.  Continue vitamin D and B12 as well as Colace daily.  Could consider low-dose antibiotic for SIBO if ongoing symptoms.  Advised to avoid, gas producing foods including dairy products.   Today:    Wt Readings from Last 3 Encounters:  01/18/23 184 lb 9.6 oz (83.7 kg)  10/23/22 183 lb 10.3 oz (83.3 kg)  10/18/22 183 lb 10.3 oz (83.3 kg)    Current Outpatient Medications  Medication Sig Dispense Refill   acetaminophen (TYLENOL) 650 MG CR tablet Take 650 mg by mouth at bedtime.     Alum & Mag Hydroxide-Simeth (MYLANTA PO) Take 15-30 mLs by mouth 2 (two) times daily as needed (upset stomach).      aspirin EC 81 MG tablet Take 81 mg by mouth daily.     atenolol (TENORMIN) 25 MG tablet Take 25 mg by mouth at bedtime.      atorvastatin (LIPITOR) 20 MG tablet Take 20 mg by mouth every evening.      benazepril (LOTENSIN) 5 MG tablet Take 5 mg by mouth daily.      Cholecalciferol (VITAMIN D) 2000 units tablet Take 2,000 Units by mouth daily.     docusate sodium (COLACE) 100 MG capsule Take 100 mg by mouth every other day.     famotidine (PEPCID) 40 MG tablet TAKE 1 TABLET(40 MG) BY MOUTH DAILY 90 tablet 1  l-methylfolate-B6-B12 (METANX) 3-35-2 MG TABS tablet Take 1 tablet by mouth daily.     lansoprazole (PREVACID) 30 MG capsule Take 1 capsule (30 mg total) by mouth 2 (two) times daily. 60 capsule 1   Magnesium 250 MG TABS Take 250 mg by mouth 2 (two) times a week.     metFORMIN (GLUCOPHAGE) 500 MG tablet Take 1,000 mg by mouth 2 (two) times daily with a meal.     mirabegron ER (MYRBETRIQ) 50 MG TB24 tablet Take 50 mg by mouth daily.     NURTEC 75 MG TBDP Take 1 tablet by mouth daily as needed (Migraine).     OVER THE COUNTER MEDICATION Take 1 tablet by mouth daily at 6 (six) AM. Insta Flex for joints daily     RYBELSUS 7 MG  TABS Take 1 tablet by mouth daily.     Simethicone 125 MG TABS Take 1 tablet (125 mg total) by mouth 3 (three) times daily as needed. 120 tablet 1   ST JOHNS WORT PO Take 600 mg by mouth 2 (two) times daily.     No current facility-administered medications for this visit.    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    Urgency of urination     Past Surgical History:  Procedure Laterality Date   BIOPSY  06/28/2018   Procedure: BIOPSY;  Surgeon: West Bali, MD;  [facility];  Service: Endoscopy;;   CATARACT EXTRACTION W/ INTRAOCULAR LENS  IMPLANT, BILATERAL     COLONOSCOPY  03/22/2012    Romie Levee, MD-NL BUT INCOMPLETE   COLONOSCOPY WITH PROPOFOL N/A 10/23/2022   Procedure: COLONOSCOPY WITH PROPOFOL;  Surgeon: Lanelle Bal, DO;  [facility];  Service: Endoscopy;  Laterality: N/A;  7:30 am, asa 3   DILATION AND CURETTAGE OF UTERUS     ESOPHAGOGASTRODUODENOSCOPY N/A 06/28/2018   Procedure: ESOPHAGOGASTRODUODENOSCOPY (EGD);  Surgeon: West Bali, MD;  [facility];  Service: Endoscopy;  Laterality: N/A;  12:00pm   JOINT REPLACEMENT     KNEE ARTHROSCOPY Left 01/2017   POLYPECTOMY  10/23/2022   Procedure: POLYPECTOMY;  Surgeon: Lanelle Bal, DO;  [facility];  Service: Endoscopy;;   PUBOVAGINAL SLING  08/25/2011   Procedure: Leonides Grills;  Surgeon: Kathi Ludwig, MD;  Location:  [facility];  Service: Urology;  Laterality: N/A;  BOSTON SCIENTIFIC MESH UPHOLD LITE ANTERIOR VAULT REPAIR    SHOULDER ARTHROSCOPY WITH OPEN ROTATOR CUFF REPAIR AND DISTAL CLAVICLE ACROMINECTOMY Right 08/10/2021   Procedure: SHOULDER ARTHROSCOPY WITH ACROMIOPLASTY, OPEN ROTATOR CUFF REPAIR AND OPEN DISTAL CLAVICLECTOMY;  Surgeon: Frederico Hamman, MD;  Location: [facility];  Service: Orthopedics;  Laterality: Right;   TONSILLECTOMY AND ADENOIDECTOMY  AGE 70   TOTAL ABDOMINAL HYSTERECTOMY  1990   w/BSO  TOTAL KNEE ARTHROPLASTY Left 11/02/2017   TOTAL KNEE ARTHROPLASTY Left 11/02/2017   Procedure: LEFT TOTAL KNEE ARTHROPLASTY;  Surgeon: Frederico Hamman, MD;  Location: [facility];  Service: Orthopedics;  Laterality: Left;    Family History  Problem Relation Age of Onset   Cancer Mother        lung   Alcohol abuse Father    Colon cancer Neg Hx    Colon polyps Neg Hx     Allergies as of 06/13/2023 - Review Complete 01/18/2023  Allergen Reaction Noted   Elemental sulfur Nausea And Vomiting 10/17/2017    Social History   Socioeconomic History   Marital status: Divorced    Spouse name: Not on file   Number of children: Not on file   Years of education: Not on file   Highest education level: Not on file  Occupational History   Not on file  Tobacco Use   Smoking status: Never   Smokeless tobacco: Never  Vaping Use   Vaping status: Never Used  Substance and Sexual Activity   Alcohol use: No   Drug use: No   Sexual activity: Not Currently  Other Topics Concern   Not on file  Social History Narrative   Not on file   Social Drivers of Health   Financial Resource Strain: Not on file  Food Insecurity: Not on file  Transportation Needs: Not on file  Physical Activity: Not on file  Stress: Not on file  Social Connections: Not on file     Review of Systems   Gen: Denies fever, chills, anorexia. Denies fatigue, weakness, weight loss.  CV: Denies  chest pain, palpitations, syncope, peripheral edema, and claudication. Resp: Denies dyspnea at rest, cough, wheezing, coughing up blood, and pleurisy. GI: See HPI Derm: Denies rash, itching, dry skin Psych: Denies depression, anxiety, memory loss, confusion. No homicidal or suicidal ideation.  Heme: Denies bruising, bleeding, and enlarged lymph nodes.  Physical Exam   There were no vitals taken for this visit.  General:   Alert and oriented. No distress noted. Pleasant and cooperative.  Head:  Normocephalic and atraumatic. Eyes:  Conjuctiva clear without scleral icterus. Mouth:  Oral mucosa pink and moist. Good dentition. No lesions. Lungs:  Clear to auscultation bilaterally. No wheezes, rales, or rhonchi. No distress.  Heart:  S1, S2 present without murmurs appreciated.  Abdomen:  +BS, soft, non-tender and non-distended. No rebound or guarding. No HSM or masses noted. Rectal: *** Msk:  Symmetrical without gross deformities. Normal posture. Extremities:  Without edema. Neurologic:  Alert and  oriented x4 Psych:  Alert and cooperative. Normal mood and affect.  Assessment  Kristin Farley is a 78 y.o. female with a history of *** presenting today with   Flatulence:  GERD:  Anemia:   PLAN   ***     Brooke Bonito, MSN, FNP-BC, AGACNP-BC [facility]

## 2023-06-13 ENCOUNTER — Ambulatory Visit: Payer: Medicare Other | Admitting: Gastroenterology

## 2023-06-13 ENCOUNTER — Encounter: Payer: Self-pay | Admitting: Gastroenterology

## 2023-06-14 DIAGNOSIS — E1140 Type 2 diabetes mellitus with diabetic neuropathy, unspecified: Secondary | ICD-10-CM | POA: Diagnosis not present

## 2023-07-08 DIAGNOSIS — E119 Type 2 diabetes mellitus without complications: Secondary | ICD-10-CM | POA: Diagnosis not present

## 2023-07-25 NOTE — Progress Notes (Unsigned)
 GI Office Note    Referring Provider: [name], MD Primary Care Physician:  [name], MD Primary Gastroenterologist: Hennie Duos. Marletta Lor, DO  Date:  07/26/2023  ID:  Kristin Farley, DOB Apr 02, 1946, MRN [id number]   Chief Complaint   Chief Complaint  Patient presents with   Follow-up    Follow up. No problems    History of Present Illness  Kristin Farley is a 78 y.o. female with a history of GERD, anxiety, HTN, HLD, migraines, OSA with CPAP, type 2 diabetes, mitral valve prolapse, and anemia presenting today for follow up.   Colonoscopy in 2013:  -no masses on DRE -unable to get through past the transverse colon   EGD 06/28/2018: -Benign-appearing esophageal stricture due to GERD -Mild gastritis secondary to NSAIDs -Multiple gastric polyps, removed.  -Advised diabetic diet and if no evidence of celiac or H. pylori she will need antibiotics for SIBO -path: normal, no H. pylori.   Labs in 2020 with negative celiac serologies.  Colonoscopy 10/23/2022: - two 3-5 mm polyps in the cecum -6 mm polyp in the transverse colon -tortuous colon -Path: Tube adenomas without dysplasia -No repeat colonoscopy due to age   Labs via [facility] system reviewed dated 11/28/2022: Hemoglobin 11, platelets 260, CMP unremarkable.  TSH within normal limits.  Total cholesterol and LDL elevated.  Last office visit 01/18/23.  Reporting that every thing that she intakes causes gas.  She reports has been present ever since having her bladder tacked up.  Taking lots of Gas-X, trying to push herself to have a bowel movement prior to church due to embarrassment of her flatulence.  Taking ginger bounty probiotic.  Added lansoprazole for 1 week and reports using Mylanta 3-4 nights a week and small amounts given she eats late.  Taking famotidine 40 mg at bedtime.  Stool softener 3-4 times a week but does not feel constipated.  She reportedly drinks Lactaid milk given regular milk causes her discomfort.   Sucrase testing ordered.  Advised simethicone 125 mg 3 times daily.  Consider probiotic and continue lansoprazole 30 mg twice daily as well as famotidine 40 mg nightly.  Advised we could consider low-dose antibiotic for SIBO if having ongoing symptoms.  Today:  When she eats she is having a problem with gas. She states friend  is tacking simethicone and having good relief of symptoms. She ran out of probiotic and since there are so many options. No abdominal pain.   Sometimes BM twice daily and sometime goes every 2 days. Taking stool softener three times a week. Sometimes has to strain and sometimes has diarrhea - thinks that may come from metformin. No melena or brbrp.   Taking tylenol pm to help with pain and sleep.   Is taking famotidine nightly to help with reflux. Taking lansoprazole twice daily as well.   Denies chest pain but if she walks fast or  a long distance she will have some mild shortness of breath and does not happen all the time. Has been having some palpitations at night that sometimes wakes her up. Takes atenolol nightly.   Wt Readings from Last 3 Encounters:  07/26/23 188 lb 9.6 oz (85.5 kg)  01/18/23 184 lb 9.6 oz (83.7 kg)  10/23/22 183 lb 10.3 oz (83.3 kg)    Current Outpatient Medications  Medication Sig Dispense Refill   acetaminophen (TYLENOL) 650 MG CR tablet Take 650 mg by mouth at bedtime.     Alum & Mag Hydroxide-Simeth (MYLANTA PO) Take  15-30 mLs by mouth 2 (two) times daily as needed (upset stomach).      aspirin EC 81 MG tablet Take 81 mg by mouth daily.     atenolol (TENORMIN) 25 MG tablet Take 25 mg by mouth at bedtime.      atorvastatin (LIPITOR) 20 MG tablet Take 20 mg by mouth every evening.      benazepril (LOTENSIN) 5 MG tablet Take 5 mg by mouth daily.      Cholecalciferol (VITAMIN D) 2000 units tablet Take 2,000 Units by mouth daily.     docusate sodium (COLACE) 100 MG capsule Take 100 mg by mouth every other day.     l-methylfolate-B6-B12  (METANX) 3-35-2 MG TABS tablet Take 1 tablet by mouth daily.     lansoprazole (PREVACID) 30 MG capsule Take 1 capsule (30 mg total) by mouth 2 (two) times daily. 60 capsule 1   Magnesium 250 MG TABS Take 250 mg by mouth 2 (two) times a week.     metFORMIN (GLUCOPHAGE) 500 MG tablet Take 1,000 mg by mouth 2 (two) times daily with a meal.     mirabegron ER (MYRBETRIQ) 50 MG TB24 tablet Take 50 mg by mouth daily.     NURTEC 75 MG TBDP Take 1 tablet by mouth daily as needed (Migraine).     OVER THE COUNTER MEDICATION Take 1 tablet by mouth daily at 6 (six) AM. Insta Flex for joints daily     ST JOHNS WORT PO Take 600 mg by mouth 2 (two) times daily.     famotidine (PEPCID) 40 MG tablet TAKE 1 TABLET(40 MG) BY MOUTH DAILY (Patient not taking: Reported on 07/26/2023) 90 tablet 1   RYBELSUS 7 MG TABS Take 1 tablet by mouth daily. (Patient not taking: Reported on 07/26/2023)     Simethicone 125 MG TABS Take 1 tablet (125 mg total) by mouth 3 (three) times daily as needed. (Patient not taking: Reported on 07/26/2023) 120 tablet 1   No current facility-administered medications for this visit.    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    Urgency of urination     Past Surgical History:  Procedure Laterality Date   BIOPSY  06/28/2018   Procedure: BIOPSY;  Surgeon: Alyce Jubilee, MD;  [facility];  Service: Endoscopy;;   CATARACT EXTRACTION W/ INTRAOCULAR  LENS  IMPLANT, BILATERAL     COLONOSCOPY  03/22/2012    Joyce Nixon, MD-NL BUT INCOMPLETE   COLONOSCOPY WITH PROPOFOL N/A 10/23/2022   Procedure: COLONOSCOPY WITH PROPOFOL;  Surgeon: Vinetta Greening, DO;  [facility]: [facility];  Service: Endoscopy;  Laterality: N/A;  7:30 am, asa 3   DILATION AND CURETTAGE OF UTERUS     ESOPHAGOGASTRODUODENOSCOPY N/A 06/28/2018   Procedure: ESOPHAGOGASTRODUODENOSCOPY (EGD);  Surgeon: Alyce Jubilee, MD;  [facility];  Service: Endoscopy;  Laterality: N/A;  12:00pm   JOINT REPLACEMENT     KNEE ARTHROSCOPY Left 01/2017   POLYPECTOMY  10/23/2022   Procedure: POLYPECTOMY;  Surgeon: Vinetta Greening, DO;  [facility]: [facility];  Service: Endoscopy;;   PUBOVAGINAL SLING  08/25/2011   Procedure: Gino Lais;  Surgeon: Edmund Gouge, MD;  Location: [facility];  Service: Urology;  Laterality: N/A;  BOSTON SCIENTIFIC MESH UPHOLD LITE ANTERIOR VAULT REPAIR    SHOULDER ARTHROSCOPY WITH OPEN ROTATOR CUFF REPAIR AND DISTAL CLAVICLE ACROMINECTOMY Right 08/10/2021   Procedure: SHOULDER ARTHROSCOPY WITH ACROMIOPLASTY, OPEN ROTATOR CUFF REPAIR AND OPEN DISTAL CLAVICLECTOMY;  Surgeon: Marlena Sima, MD;  Location: [facility];  Service: Orthopedics;  Laterality: Right;   TONSILLECTOMY AND ADENOIDECTOMY  AGE 80   TOTAL ABDOMINAL HYSTERECTOMY  1990   w/BSO   TOTAL KNEE ARTHROPLASTY Left 11/02/2017   TOTAL KNEE ARTHROPLASTY Left 11/02/2017   Procedure: LEFT TOTAL KNEE ARTHROPLASTY;  Surgeon: Marlena Sima, MD;  Location: [facility];  Service: Orthopedics;  Laterality: Left;    Family History  Problem Relation Age of Onset   Cancer Mother        lung   Alcohol abuse Father    Colon cancer Neg Hx    Colon polyps Neg Hx     Allergies as of 07/26/2023 - Review Complete 07/26/2023  Allergen Reaction Noted   Elemental sulfur Nausea And Vomiting 10/17/2017    Social History   Socioeconomic History   Marital  status: Divorced    Spouse name: Not on file   Number of children: Not on file   Years of education: Not on file   Highest education level: Not on file  Occupational History   Not on file  Tobacco Use   Smoking status: Never   Smokeless tobacco: Never  Vaping Use   Vaping status: Never Used  Substance and Sexual Activity   Alcohol use: No   Drug use: No   Sexual activity: Not Currently  Other Topics Concern   Not on file  Social History Narrative   Not on file   Social Drivers of Health   Financial Resource Strain: Not on file  Food Insecurity: Not on file  Transportation Needs: Not on file  Physical Activity: Not on file  Stress: Not on file  Social Connections: Not on file     Review of Systems   Gen: Denies fever, chills, anorexia. Denies fatigue, weakness, weight loss.  CV: Denies chest pain, palpitations, syncope, peripheral edema, and claudication. Resp: Denies dyspnea at rest, cough, wheezing, coughing up blood, and pleurisy. GI: See HPI Derm: Denies rash, itching, dry skin Psych: Denies depression, anxiety, memory loss, confusion. No homicidal or suicidal ideation.  Heme: Denies bruising, bleeding, and enlarged lymph nodes.  Physical Exam   BP 118/76 (BP Location: Right Arm, Patient Position: Sitting, Cuff Size: Large)   Pulse 91   Temp (!) 96.9 F (36.1 C) (Temporal)   Ht 5\' 4"  (1.626 m)   Wt 188 lb 9.6 oz (85.5 kg)   BMI 32.37 kg/m   General:   Alert and oriented. No distress noted. Pleasant and cooperative.  Head:  Normocephalic and atraumatic. Eyes:  Conjuctiva clear without scleral icterus. Mouth:  Oral mucosa pink and moist. Good dentition. No lesions.  Heart:  S1, S2 present without murmurs appreciated.  Abdomen:  +BS, soft, non-tender and non-distended. No rebound or guarding. No HSM or masses noted. Rectal: deferred  Msk:  Symmetrical without gross deformities. Normal posture. Extremities:  Without edema. Neurologic:  Alert and  oriented  x4 Psych:  Alert and cooperative. Normal mood and affect.  Assessment  Kristin Farley is a 78 y.o. female with a history of GERD, anxiety, HTN, HLD, migraines, OSA with CPAP, type 2 diabetes, mitral valve prolapse, and anemia presenting today with complaint of flatulence.   GERD: Well-controlled with lansoprazole 30 mg twice daily and famotidine 40 mg nightly.  Denies nausea, vomiting, or dysphagia.  No indication for repeat EGD at this time.  Flatulence: Continues to struggle with frequent flatulence, occasionally foul-smelling.  Denies any overt bloating or abdominal distention.  Trying to avoid gas producing foods however does admit to eating onions every so often.  Continue to advise avoidance of these types of foods.  Previously advised simethicone 125 mg 3 times a day as needed, she is interested in trying this today.  Will resend prescription today although if insurance does not cover, given over-the-counter options as well.  Previously was taking a probiotic but has recently stopped.  Encouraged her to continue this.  If no improvement with simethicone and resumption of probiotic, can consider breath testing to assess for SIBO, IMO, IHO or empiric treatment with antibiotic.  Possibly some mild constipation contributing, currently takes stool softener 3 times weekly with good results and usually daily bowel movements.  Encourage daily use and to stop if having frequent diarrhea.  Anemia: Has mild chronic anemia.  Most recent labs checked last year were stable.  Currently without any significant dyspnea, does have some occasional symptoms but only with strenuous activity.  Does have an occasional chest discomfort with palpitations, currently on atenolol.  Encouraged her to discuss this further with Dr. Bernetta Brilliant, may need to consider cardiology referral.  EGD in 2020 with gastritis and most recent colonoscopy with redundant colon and 3 polyps removed.  No indication for capsule at this time.  May  consider updating CBC at next visit.  PLAN   Probiotic daily - Align or Phillips colon health Simethicone 125 mg three times a day as needed. Discussed dietary modifications  Continue lansoprazole 30 mg BID Continue famotidine 40 mg nightly GERD diet Increase colace to nightly.  Encouraged follow up with PCP in regards to palpitations.  Consider updating CBC at next visit Follow up in 3 months.   Julian Obey, MSN, FNP-BC, AGACNP-BC [facility]

## 2023-07-26 ENCOUNTER — Encounter: Payer: Self-pay | Admitting: Gastroenterology

## 2023-07-26 ENCOUNTER — Ambulatory Visit: Admitting: Gastroenterology

## 2023-07-26 VITALS — BP 118/76 | HR 91 | Temp 96.9°F | Ht 64.0 in | Wt 188.6 lb

## 2023-07-26 DIAGNOSIS — R143 Flatulence: Secondary | ICD-10-CM

## 2023-07-26 DIAGNOSIS — D649 Anemia, unspecified: Secondary | ICD-10-CM

## 2023-07-26 DIAGNOSIS — Z860100 Personal history of colon polyps, unspecified: Secondary | ICD-10-CM | POA: Diagnosis not present

## 2023-07-26 DIAGNOSIS — K219 Gastro-esophageal reflux disease without esophagitis: Secondary | ICD-10-CM | POA: Diagnosis not present

## 2023-07-26 MED ORDER — SIMETHICONE 125 MG PO CAPS: 125.0000 mg | ORAL_CAPSULE | Freq: Three times a day (TID) | ORAL | 1 refills | Status: AC | PRN

## 2023-07-26 MED ORDER — LANSOPRAZOLE 30 MG PO CPDR
30.0000 mg | DELAYED_RELEASE_CAPSULE | Freq: Two times a day (BID) | ORAL | 5 refills | Status: AC
Start: 2023-07-26 — End: ?

## 2023-07-26 MED ORDER — FAMOTIDINE 40 MG PO TABS
40.0000 mg | ORAL_TABLET | Freq: Every day | ORAL | 1 refills | Status: DC
Start: 2023-07-26 — End: 2023-09-05

## 2023-07-26 NOTE — Patient Instructions (Addendum)
 Continue taking lansoprazole 30 mg twice daily and your famotidine 40 mg nightly.  Follow a GERD diet:  Avoid fried, fatty, greasy, spicy, citrus foods. Avoid caffeine and carbonated beverages. Avoid chocolate. Try eating 4-6 small meals a day rather than 3 large meals. Do not eat within 3 hours of laying down. Prop head of bed up on wood or bricks to create a 6 inch incline.  Avoid gas-producing foods (eg, garlic, cabbage, legumes, onions, broccoli, brussel sprouts, wheat, and potatoes).   For the flatulence I am sending in simethicone for you to take up to 3 times a day as needed.  If they will not fill the prescription for you, you can get this over-the-counter.     Please start taking your Colace once daily.  Follow-up in 3 months.  It was a pleasure to see you today. I want to create trusting relationships with patients. If you receive a survey regarding your visit,  I greatly appreciate you taking time to fill this out on paper or through your MyChart. I value your feedback.  Julian Obey, MSN, FNP-BC, AGACNP-BC [facility]

## 2023-08-08 DIAGNOSIS — E119 Type 2 diabetes mellitus without complications: Secondary | ICD-10-CM | POA: Diagnosis not present

## 2023-08-16 DIAGNOSIS — M25562 Pain in left knee: Secondary | ICD-10-CM | POA: Diagnosis not present

## 2023-09-05 ENCOUNTER — Other Ambulatory Visit: Payer: Self-pay | Admitting: Gastroenterology

## 2023-09-05 DIAGNOSIS — I10 Essential (primary) hypertension: Secondary | ICD-10-CM | POA: Diagnosis not present

## 2023-09-05 DIAGNOSIS — Z7189 Other specified counseling: Secondary | ICD-10-CM | POA: Diagnosis not present

## 2023-09-05 DIAGNOSIS — Z299 Encounter for prophylactic measures, unspecified: Secondary | ICD-10-CM | POA: Diagnosis not present

## 2023-09-05 DIAGNOSIS — Z1389 Encounter for screening for other disorder: Secondary | ICD-10-CM | POA: Diagnosis not present

## 2023-09-05 DIAGNOSIS — K219 Gastro-esophageal reflux disease without esophagitis: Secondary | ICD-10-CM

## 2023-09-05 DIAGNOSIS — E1169 Type 2 diabetes mellitus with other specified complication: Secondary | ICD-10-CM | POA: Diagnosis not present

## 2023-09-05 DIAGNOSIS — Z0000 Encounter for general adult medical examination without abnormal findings: Secondary | ICD-10-CM | POA: Diagnosis not present

## 2023-09-07 DIAGNOSIS — S62356A Nondisplaced fracture of shaft of fifth metacarpal bone, right hand, initial encounter for closed fracture: Secondary | ICD-10-CM | POA: Diagnosis not present

## 2023-09-08 DIAGNOSIS — E119 Type 2 diabetes mellitus without complications: Secondary | ICD-10-CM | POA: Diagnosis not present

## 2023-09-20 DIAGNOSIS — E1140 Type 2 diabetes mellitus with diabetic neuropathy, unspecified: Secondary | ICD-10-CM | POA: Diagnosis not present

## 2023-10-05 DIAGNOSIS — S62356D Nondisplaced fracture of shaft of fifth metacarpal bone, right hand, subsequent encounter for fracture with routine healing: Secondary | ICD-10-CM | POA: Diagnosis not present

## 2023-10-08 DIAGNOSIS — E119 Type 2 diabetes mellitus without complications: Secondary | ICD-10-CM | POA: Diagnosis not present

## 2023-10-10 DIAGNOSIS — R52 Pain, unspecified: Secondary | ICD-10-CM | POA: Diagnosis not present

## 2023-10-10 DIAGNOSIS — E1165 Type 2 diabetes mellitus with hyperglycemia: Secondary | ICD-10-CM | POA: Diagnosis not present

## 2023-10-10 DIAGNOSIS — N390 Urinary tract infection, site not specified: Secondary | ICD-10-CM | POA: Diagnosis not present

## 2023-10-10 DIAGNOSIS — I10 Essential (primary) hypertension: Secondary | ICD-10-CM | POA: Diagnosis not present

## 2023-10-10 DIAGNOSIS — Z299 Encounter for prophylactic measures, unspecified: Secondary | ICD-10-CM | POA: Diagnosis not present

## 2023-10-18 DIAGNOSIS — M25561 Pain in right knee: Secondary | ICD-10-CM | POA: Diagnosis not present

## 2023-10-22 NOTE — Progress Notes (Deleted)
 GI Office Note    Referring Provider: Maree Isles, MD Primary Care Physician:  Maree Isles, MD Primary Gastroenterologist: Carlin POUR. Cindie, DO  Date:  10/22/2023  ID:  Kristin Farley, DOB 01-Nov-1945, MRN [id number]  Chief Complaint   No chief complaint on file.  History of Present Illness  Kristin Farley is a 78 y.o. female with a history of anxiety, GERD, HTN, HLD, migraines, OSA with CPAP, type 2 diabetes, mitral valve prolapse, and anemia presenting today with complaint of ***  Colonoscopy in 2013:  -no masses on DRE -unable to get through past the transverse colon   EGD 06/28/2018: -Benign-appearing esophageal stricture due to GERD -Mild gastritis secondary to NSAIDs -Multiple gastric polyps, removed.  -Advised diabetic diet and if no evidence of celiac or H. pylori she will need antibiotics for SIBO -path: normal, no H. pylori.   Colonoscopy 10/23/2022: - two 3-5 mm polyps in the cecum -6 mm polyp in the transverse colon -tortuous colon -Path: Tube adenomas without dysplasia -No repeat colonoscopy due to age   Labs via [facility] system reviewed dated 11/28/2022: Hemoglobin 11, platelets 260, CMP unremarkable.  TSH within normal limits.  Total cholesterol and LDL elevated. Prior history of negative celiac labs.    OV 01/18/23.  Reporting that every thing that she intakes causes gas.  She reports has been present ever since having her bladder tacked up.  Taking lots of Gas-X, trying to push herself to have a bowel movement prior to church due to embarrassment of her flatulence.  Taking ginger bounty probiotic.  Added lansoprazole  for 1 week and reports using Mylanta 3-4 nights a week and small amounts given she eats late.  Taking famotidine  40 mg at bedtime.  Stool softener 3-4 times a week but does not feel constipated.  She reportedly drinks Lactaid milk given regular milk causes her discomfort.  Sucrase testing ordered.  Advised simethicone  125 mg 3 times daily.   Consider probiotic and continue lansoprazole  30 mg twice daily as well as famotidine  40 mg nightly.  Advised we could consider low-dose antibiotic for SIBO if having ongoing symptoms.  Last office visit ***.    Today:    Wt Readings from Last 5 Encounters:  07/26/23 188 lb 9.6 oz (85.5 kg)  01/18/23 184 lb 9.6 oz (83.7 kg)  10/23/22 183 lb 10.3 oz (83.3 kg)  10/18/22 183 lb 10.3 oz (83.3 kg)  09/12/22 183 lb 11.2 oz (83.3 kg)    Current Outpatient Medications  Medication Sig Dispense Refill   acetaminophen  (TYLENOL ) 650 MG CR tablet Take 650 mg by mouth at bedtime.     Alum & Mag Hydroxide-Simeth (MYLANTA PO) Take 15-30 mLs by mouth 2 (two) times daily as needed (upset stomach).      aspirin  EC 81 MG tablet Take 81 mg by mouth daily.     atenolol  (TENORMIN ) 25 MG tablet Take 25 mg by mouth at bedtime.      atorvastatin  (LIPITOR) 20 MG tablet Take 20 mg by mouth every evening.      benazepril  (LOTENSIN ) 5 MG tablet Take 5 mg by mouth daily.      Cholecalciferol (VITAMIN D ) 2000 units tablet Take 2,000 Units by mouth daily.     docusate sodium  (COLACE) 100 MG capsule Take 100 mg by mouth every other day.     famotidine  (PEPCID ) 40 MG tablet TAKE 1 TABLET(40 MG) BY MOUTH DAILY 90 tablet 1   HYDROcodone -acetaminophen  (NORCO/VICODIN) 5-325 MG tablet Take 1 tablet  by mouth every 8 (eight) hours as needed.     l-methylfolate-B6-B12 (METANX) 3-35-2 MG TABS tablet Take 1 tablet by mouth daily.     lansoprazole  (PREVACID ) 30 MG capsule Take 1 capsule (30 mg total) by mouth 2 (two) times daily. 60 capsule 5   Magnesium  250 MG TABS Take 250 mg by mouth 2 (two) times a week.     meloxicam (MOBIC) 15 MG tablet Take 15 mg by mouth daily.     metFORMIN  (GLUCOPHAGE ) 500 MG tablet Take 1,000 mg by mouth 2 (two) times daily with a meal.     mirabegron  ER (MYRBETRIQ ) 50 MG TB24 tablet Take 50 mg by mouth daily.     naproxen (NAPROSYN) 500 MG tablet SMARTSIG:1 Tablet(s) By Mouth Every 12 Hours PRN      nitrofurantoin (MACRODANTIN) 100 MG capsule Take 100 mg by mouth 2 (two) times daily.     NURTEC 75 MG TBDP Take 1 tablet by mouth daily as needed (Migraine).     OVER THE COUNTER MEDICATION Take 1 tablet by mouth daily at 6 (six) AM. Insta Flex for joints daily     rosuvastatin (CRESTOR) 20 MG tablet Take 20 mg by mouth daily.     Simethicone  125 MG CAPS Take 1 capsule (125 mg total) by mouth 3 (three) times daily as needed. 90 capsule 1   ST JOHNS WORT PO Take 600 mg by mouth 2 (two) times daily.     No current facility-administered medications for this visit.    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    Urgency of urination     Past Surgical History:  Procedure Laterality Date   BIOPSY  06/28/2018   Procedure: BIOPSY;  Surgeon: Harvey Margo CROME, MD;  [facility];  Service: Endoscopy;;   CATARACT EXTRACTION W/ INTRAOCULAR LENS  IMPLANT, BILATERAL     COLONOSCOPY  03/22/2012    Bernarda Ned, MD-NL BUT INCOMPLETE   COLONOSCOPY WITH PROPOFOL  N/A 10/23/2022   Procedure: COLONOSCOPY WITH PROPOFOL ;  Surgeon: Cindie Carlin POUR, DO;  [facility];  Service: Endoscopy;  Laterality: N/A;  7:30 am, asa 3   DILATION AND CURETTAGE OF UTERUS     ESOPHAGOGASTRODUODENOSCOPY N/A 06/28/2018   Procedure: ESOPHAGOGASTRODUODENOSCOPY (EGD);  Surgeon: Harvey Margo CROME, MD;  [facility];  Service: Endoscopy;  Laterality: N/A;  12:00pm    JOINT REPLACEMENT     KNEE ARTHROSCOPY Left 01/2017   POLYPECTOMY  10/23/2022   Procedure: POLYPECTOMY;  Surgeon: Cindie Carlin POUR, DO;  [facility];  Service: Endoscopy;;   PUBOVAGINAL SLING  08/25/2011   Procedure: CARLOYN GLADE;  Surgeon: Arlena LILLETTE Gal, MD;  Location[facility];  Service: Urology;  Laterality: N/A;  BOSTON SCIENTIFIC MESH UPHOLD LITE ANTERIOR VAULT REPAIR    SHOULDER  ARTHROSCOPY WITH OPEN ROTATOR CUFF REPAIR AND DISTAL CLAVICLE ACROMINECTOMY Right 08/10/2021   Procedure: SHOULDER ARTHROSCOPY WITH ACROMIOPLASTY, OPEN ROTATOR CUFF REPAIR AND OPEN DISTAL CLAVICLECTOMY;  Surgeon: Shari Sieving, MD;  [facility];  Service: Orthopedics;  Laterality: Right;   TONSILLECTOMY AND ADENOIDECTOMY  AGE 70   TOTAL ABDOMINAL HYSTERECTOMY  1990   w/BSO   TOTAL KNEE ARTHROPLASTY Left 11/02/2017   TOTAL KNEE ARTHROPLASTY Left 11/02/2017   Procedure: LEFT TOTAL KNEE ARTHROPLASTY;  Surgeon: Shari Sieving, MD;  Location: [facility];  Service: Orthopedics;  Laterality: Left;    Family History  Problem Relation Age of Onset   Cancer Mother        lung   Alcohol abuse Father    Colon cancer Neg Hx    Colon polyps Neg Hx     Allergies as of 10/24/2023 - Review Complete 07/26/2023  Allergen Reaction Noted   Elemental sulfur Nausea And Vomiting 10/17/2017    Social History   Socioeconomic History   Marital status: Divorced    Spouse name: Not on file   Number of children: Not on file   Years of education: Not on file   Highest education level: Not on file  Occupational History   Not on file  Tobacco Use   Smoking status: Never   Smokeless tobacco: Never  Vaping Use   Vaping status: Never Used  Substance and Sexual Activity   Alcohol use: No   Drug use: No   Sexual activity: Not Currently  Other Topics Concern   Not on file  Social History Narrative   Not on file   Social Drivers of Health   Financial Resource  Strain: Not on file  Food Insecurity: Not on file  Transportation Needs: Not on file  Physical Activity: Not on file  Stress: Not on file  Social Connections: Not on file     Review of Systems   Gen: Denies fever, chills, anorexia. Denies fatigue, weakness, weight loss.  CV: Denies chest pain, palpitations, syncope, peripheral edema, and claudication. Resp: Denies dyspnea at rest, cough, wheezing, coughing up blood, and pleurisy. GI: See HPI Derm: Denies rash, itching, dry skin Psych: Denies depression, anxiety, memory loss, confusion. No homicidal or suicidal ideation.  Heme: Denies bruising, bleeding, and enlarged lymph nodes.  Physical Exam   There were no vitals taken for this visit.  General:   Alert and oriented. No distress noted. Pleasant and cooperative.  Head:  Normocephalic and atraumatic. Eyes:  Conjuctiva clear without scleral icterus. Mouth:  Oral mucosa pink and moist. Good dentition. No lesions. Lungs:  Clear to auscultation bilaterally. No wheezes, rales, or rhonchi. No distress.  Heart:  S1, S2 present without murmurs appreciated.  Abdomen:  +BS, soft, non-tender and non-distended. No rebound or guarding. No HSM or masses noted. Rectal: *** Msk:  Symmetrical without gross deformities. Normal posture. Extremities:  Without edema. Neurologic:  Alert and  oriented x4 Psych:  Alert and cooperative. Normal mood and affect.  Assessment  MILAINA SHER is a 78 y.o. female presenting today with ***  GERD:  Flatulence:  Anemia:  PLAN   *** CBC Probiotic? Simethicone ? Advised to avoid common gas producing foods Lansoprazole  30 mg BID Famotidine  40 mg nightly GERD diet Follow up ***    Charmaine Melia, MSN, FNP-BC, AGACNP-BC [facility]

## 2023-10-24 ENCOUNTER — Ambulatory Visit: Admitting: Gastroenterology

## 2023-11-08 DIAGNOSIS — E119 Type 2 diabetes mellitus without complications: Secondary | ICD-10-CM | POA: Diagnosis not present

## 2023-11-13 ENCOUNTER — Ambulatory Visit: Admitting: Gastroenterology

## 2023-11-28 DIAGNOSIS — E1140 Type 2 diabetes mellitus with diabetic neuropathy, unspecified: Secondary | ICD-10-CM | POA: Diagnosis not present

## 2023-12-08 DIAGNOSIS — E119 Type 2 diabetes mellitus without complications: Secondary | ICD-10-CM | POA: Diagnosis not present

## 2023-12-13 DIAGNOSIS — E119 Type 2 diabetes mellitus without complications: Secondary | ICD-10-CM | POA: Diagnosis not present

## 2023-12-13 DIAGNOSIS — Z0000 Encounter for general adult medical examination without abnormal findings: Secondary | ICD-10-CM | POA: Diagnosis not present

## 2023-12-13 DIAGNOSIS — I10 Essential (primary) hypertension: Secondary | ICD-10-CM | POA: Diagnosis not present

## 2023-12-13 DIAGNOSIS — Z299 Encounter for prophylactic measures, unspecified: Secondary | ICD-10-CM | POA: Diagnosis not present

## 2023-12-24 ENCOUNTER — Ambulatory Visit: Admitting: Gastroenterology

## 2024-01-08 DIAGNOSIS — E119 Type 2 diabetes mellitus without complications: Secondary | ICD-10-CM | POA: Diagnosis not present

## 2024-01-31 DIAGNOSIS — M1711 Unilateral primary osteoarthritis, right knee: Secondary | ICD-10-CM | POA: Diagnosis not present

## 2024-02-11 NOTE — Progress Notes (Signed)
 Kristin Farley                                          MRN: [id number]   02/11/2024   The [facility] Quality Team Specialist reviewed this patient medical record for the purposes of chart review for care gap closure. The following were reviewed: chart review for care gap closure-kidney health evaluation for diabetes:eGFR .    [facility] Quality Team
